# Patient Record
Sex: Female | Born: 1960 | Race: White | Hispanic: No | Smoking: Never smoker
Health system: Southern US, Community
[De-identification: ages and names within clinical notes are randomized; demographics above are authoritative.]

## PROBLEM LIST (undated history)

## (undated) DIAGNOSIS — M1990 Unspecified osteoarthritis, unspecified site: Secondary | ICD-10-CM

## (undated) DIAGNOSIS — I10 Essential (primary) hypertension: Secondary | ICD-10-CM

## (undated) DIAGNOSIS — G709 Myoneural disorder, unspecified: Secondary | ICD-10-CM

## (undated) DIAGNOSIS — J45909 Unspecified asthma, uncomplicated: Secondary | ICD-10-CM

## (undated) DIAGNOSIS — F329 Major depressive disorder, single episode, unspecified: Secondary | ICD-10-CM

## (undated) DIAGNOSIS — N189 Chronic kidney disease, unspecified: Secondary | ICD-10-CM

## (undated) DIAGNOSIS — T7840XA Allergy, unspecified, initial encounter: Secondary | ICD-10-CM

## (undated) DIAGNOSIS — F32A Depression, unspecified: Secondary | ICD-10-CM

## (undated) DIAGNOSIS — F419 Anxiety disorder, unspecified: Secondary | ICD-10-CM

## (undated) DIAGNOSIS — E785 Hyperlipidemia, unspecified: Secondary | ICD-10-CM

## (undated) HISTORY — DX: Unspecified asthma, uncomplicated: J45.909

## (undated) HISTORY — DX: Chronic kidney disease, unspecified: N18.9

## (undated) HISTORY — DX: Anxiety disorder, unspecified: F41.9

## (undated) HISTORY — DX: Essential (primary) hypertension: I10

## (undated) HISTORY — DX: Allergy, unspecified, initial encounter: T78.40XA

## (undated) HISTORY — PX: APPENDECTOMY: SHX54

## (undated) HISTORY — DX: Myoneural disorder, unspecified: G70.9

## (undated) HISTORY — DX: Major depressive disorder, single episode, unspecified: F32.9

## (undated) HISTORY — DX: Hyperlipidemia, unspecified: E78.5

## (undated) HISTORY — PX: ABDOMINAL HYSTERECTOMY: SHX81

## (undated) HISTORY — PX: CHOLECYSTECTOMY: SHX55

## (undated) HISTORY — DX: Unspecified osteoarthritis, unspecified site: M19.90

## (undated) HISTORY — PX: NASAL SINUS SURGERY: SHX719

## (undated) HISTORY — DX: Depression, unspecified: F32.A

## (undated) HISTORY — PX: BRAIN SURGERY: SHX531

## (undated) HISTORY — PX: OTHER SURGICAL HISTORY: SHX169

---

## 2008-07-19 ENCOUNTER — Ambulatory Visit: Payer: Self-pay | Admitting: Internal Medicine

## 2008-07-19 ENCOUNTER — Inpatient Hospital Stay (HOSPITAL_COMMUNITY): Admission: EM | Admit: 2008-07-19 | Discharge: 2008-07-25 | Payer: Self-pay | Admitting: Emergency Medicine

## 2008-07-20 ENCOUNTER — Encounter (INDEPENDENT_AMBULATORY_CARE_PROVIDER_SITE_OTHER): Payer: Self-pay | Admitting: Internal Medicine

## 2008-10-23 ENCOUNTER — Emergency Department (HOSPITAL_COMMUNITY): Admission: EM | Admit: 2008-10-23 | Discharge: 2008-10-23 | Payer: Self-pay | Admitting: Emergency Medicine

## 2010-10-05 IMAGING — CT CT HEAD W/O CM
3 of 5 series · 16 of 47 positions shown, 19 images · non-contrast
Comparison: None relevant.

CT HEAD

CLINICAL DATA: Neck and shoulder pain status post fall.  History
of surgery for Chiari malformation.

CT HEAD WITHOUT CONTRAST
CT CERVICAL SPINE WITHOUT CONTRAST
TECHNIQUE: Multidetector CT imaging of the head and cervical spine
was performed following the standard protocol without intravenous
contrast.  Multiplanar CT image reconstructions of the cervical
spine were also generated.

[Series 4: c_spine 2.0 b31s · axial · 0.23mm/px · z∈[+944,+1074]mm · 8 of 81 slices shown, 10 images]
[im 8/81  brain]
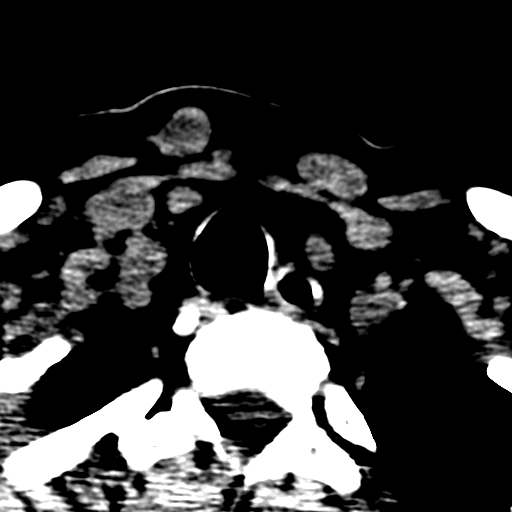
[im 8/81  bone]
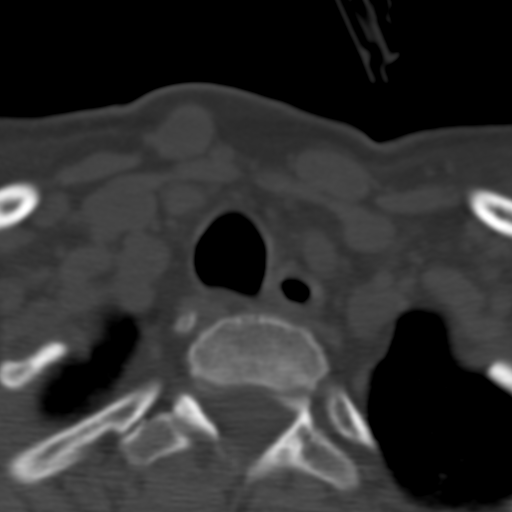
[im 15/81  bone]
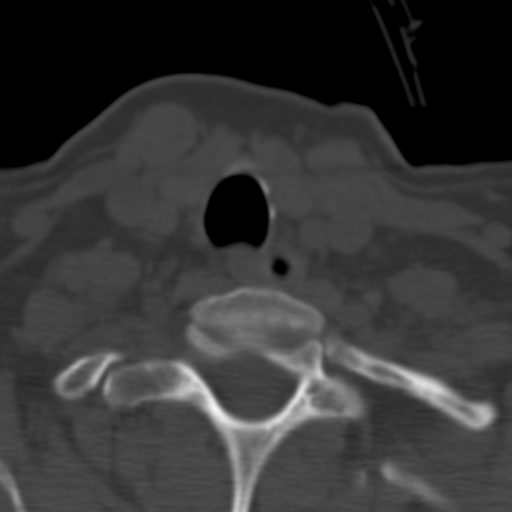
[im 30/81  bone]
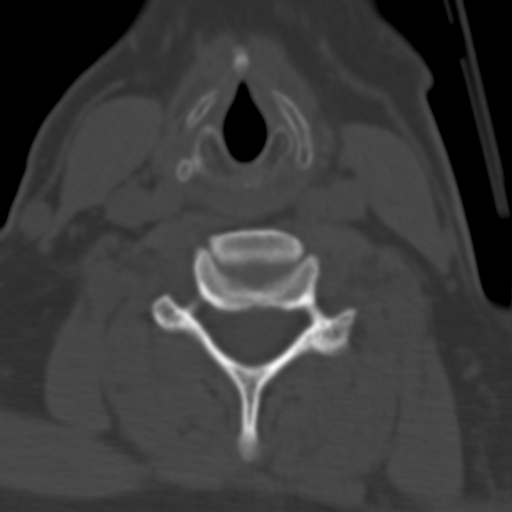
[im 37/81  bone]
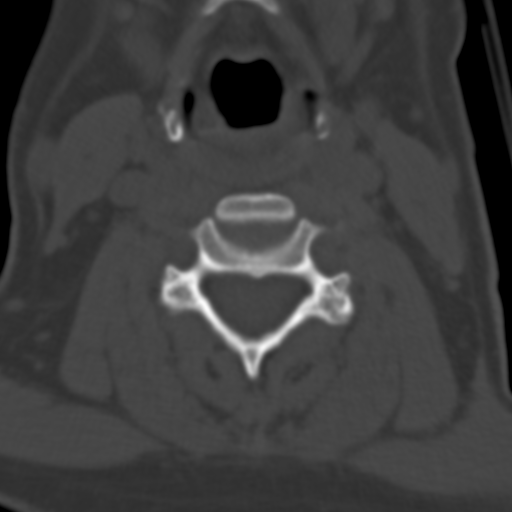
[im 44/81  brain]
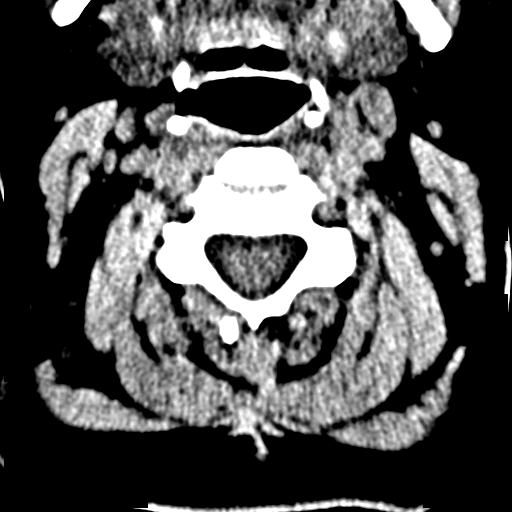
[im 44/81  bone]
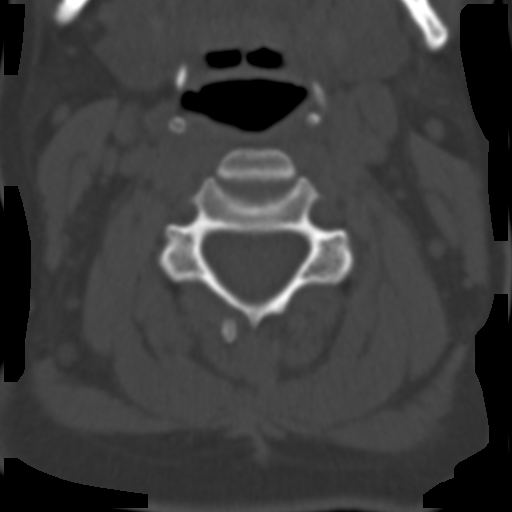
[im 51/81  bone]
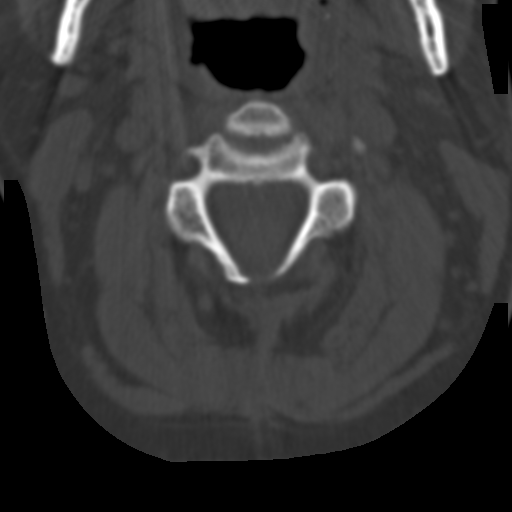
[im 66/81  bone]
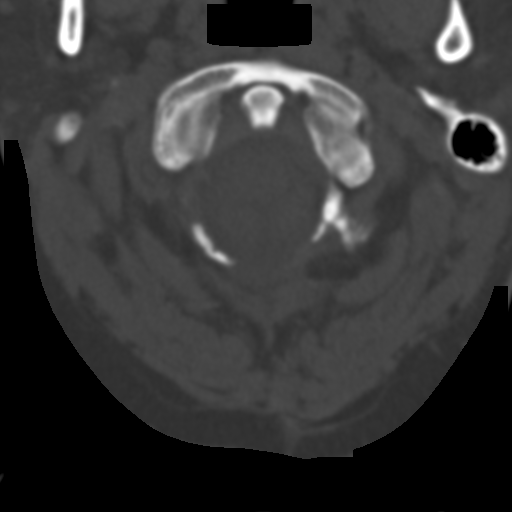
[im 73/81  bone]
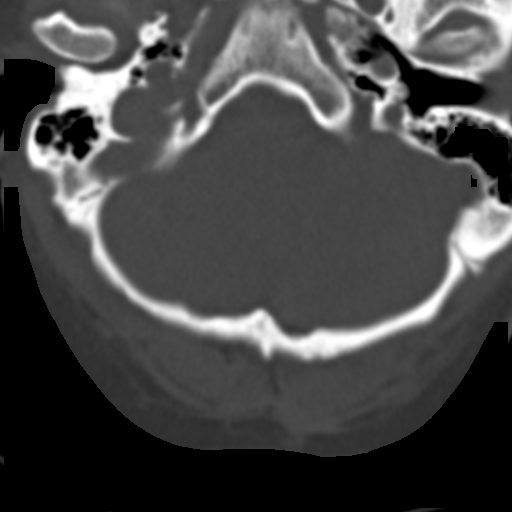

[Series 602: coronal · coronal · 0.31mm/px · 3 of 30 slices shown]
[im 10/30  bone]
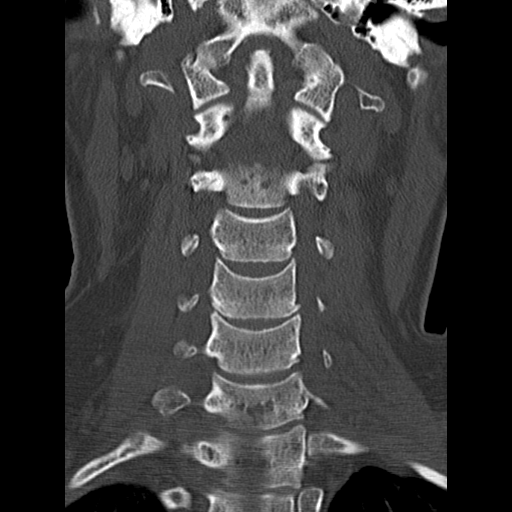
[im 13/30  bone]
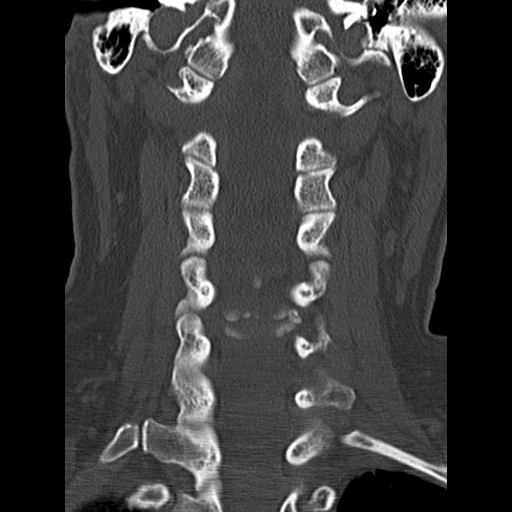
[im 17/30  bone]
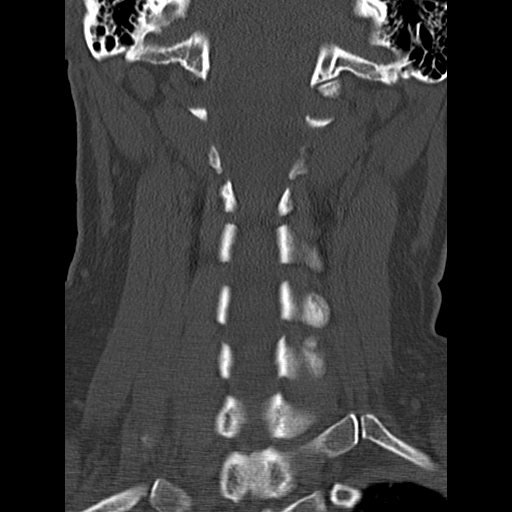

[Series 604: sagittal · sagittal · 0.31mm/px · 5 of 45 slices shown, 6 images]
[im 8/45  bone]
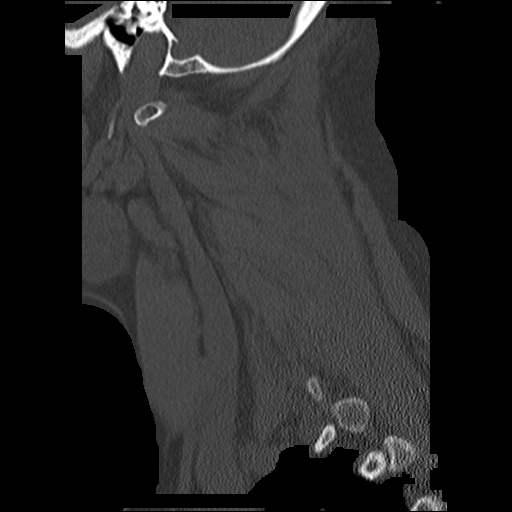
[im 15/45  bone]
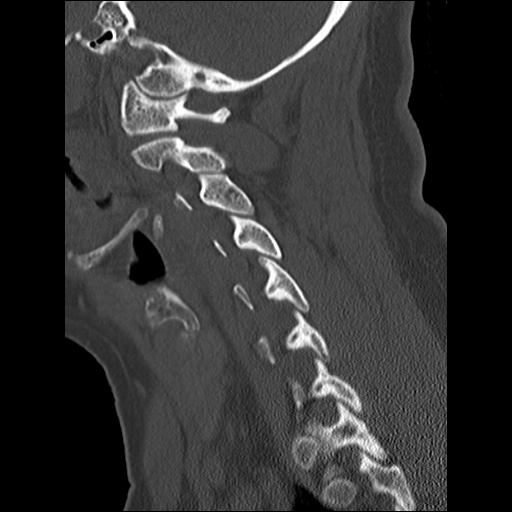
[im 23/45  brain]
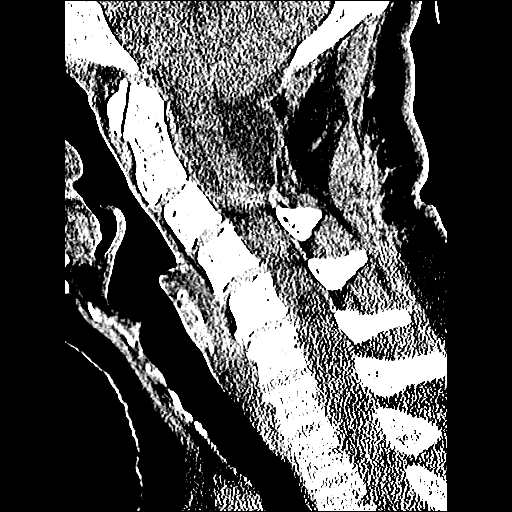
[im 23/45  bone]
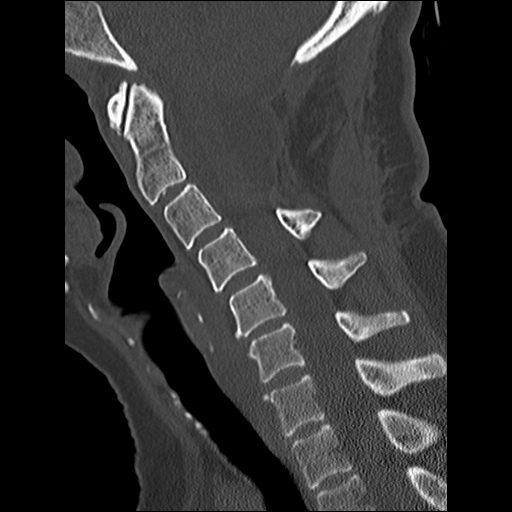
[im 30/45  bone]
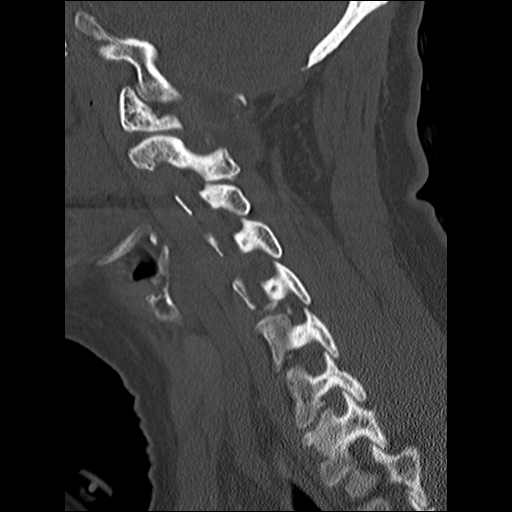
[im 37/45  bone]
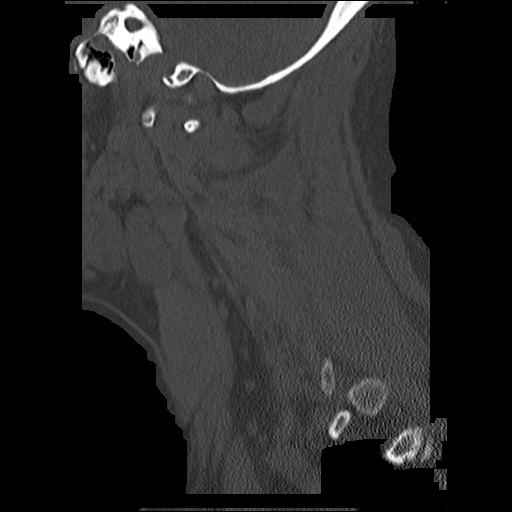

[16 of 47 positions shown; findings below may reference images not displayed]

FINDINGS: There are postsurgical changes status post occipital
craniectomy.  The cerebellar tonsils are low-lying.  There is no
evidence of acute intracranial hemorrhage, mass lesion, brain edema
or extra-axial fluid collection.  There is no hydrocephalus.

Mucosal thickening is noted throughout the maxillary and ethmoid
sinuses bilaterally.  There are possible air fluid levels in the
maxillary sinuses.  The mastoids are clear.  No acute calvarial
findings are seen.
IMPRESSION: 1.  No acute intracranial or calvarial findings.
2.  Ethmoid and maxillary sinus mucosal thickening with possible
air fluid levels suggesting sinusitis or possible occult facial
injury.

CT CERVICAL SPINE
FINDINGS: There is mild reversal of the usual cervical lordosis
without focal angulation or listhesis.  There has been previous
partial resection of the C1, C2 and C3 lamina bilaterally.  The
spinal canal is well decompressed with some dural ectasia.  There
is no evidence of acute fracture or traumatic subluxation.
IMPRESSION: 1.  Negative for acute cervical spine fracture, subluxation or
static signs of instability.
2.  Postsurgical changes in the upper cervical spine for
suboccipital decompression of a Chiari malformation.

## 2010-10-05 IMAGING — CR DG LUMBAR SPINE COMPLETE 4+V
5 series · 5 of 5 positions shown · non-contrast
Comparison: None

CLINICAL DATA: Low back pain radiating to both hips.  Status post
fall.

LUMBAR SPINE - COMPLETE 4+ VIEW

[t l-spine a.p.]
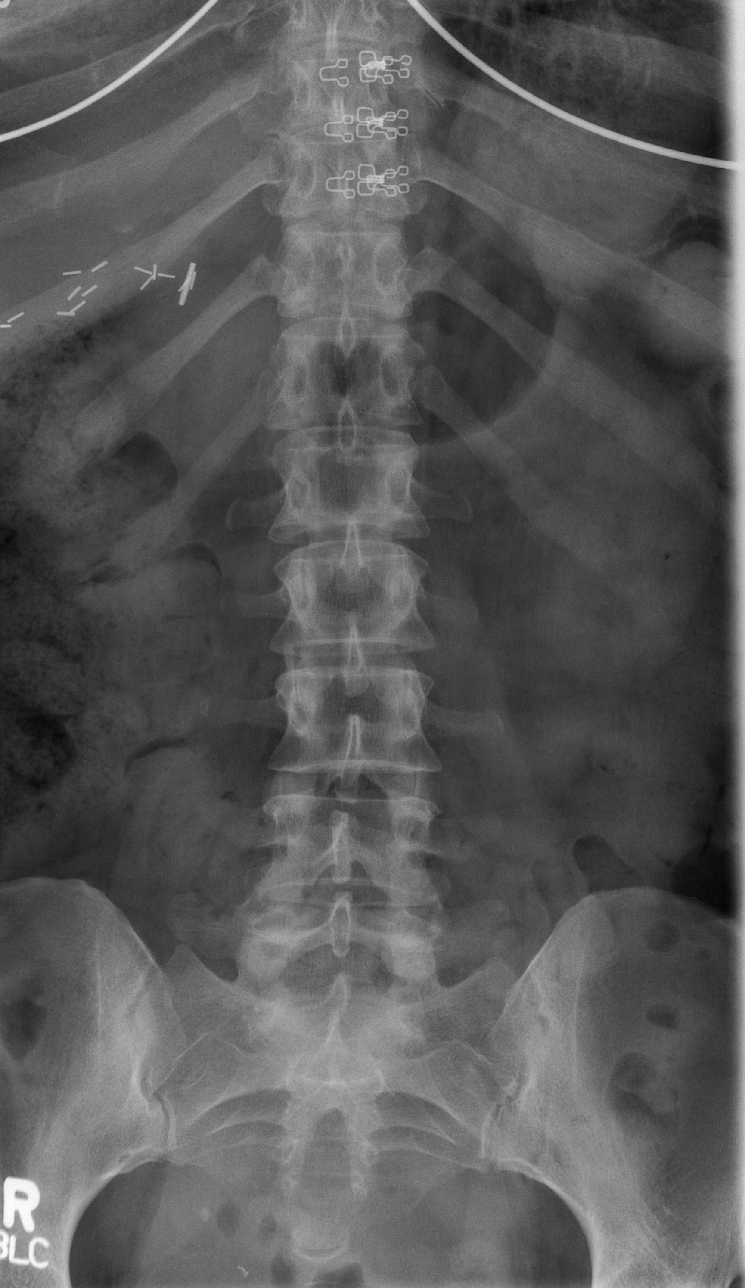

[t l-spine oblique exposure * (1 of 2)]
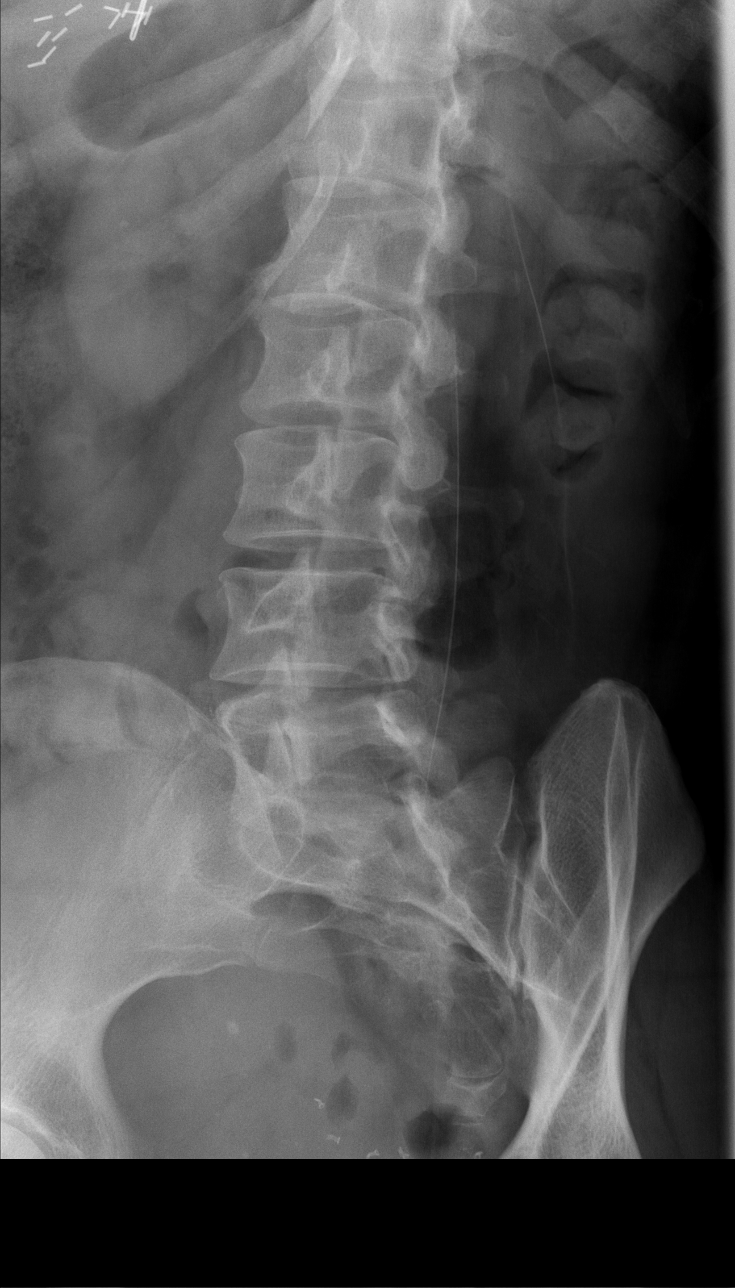

[t l-spine oblique exposure * (2 of 2)]
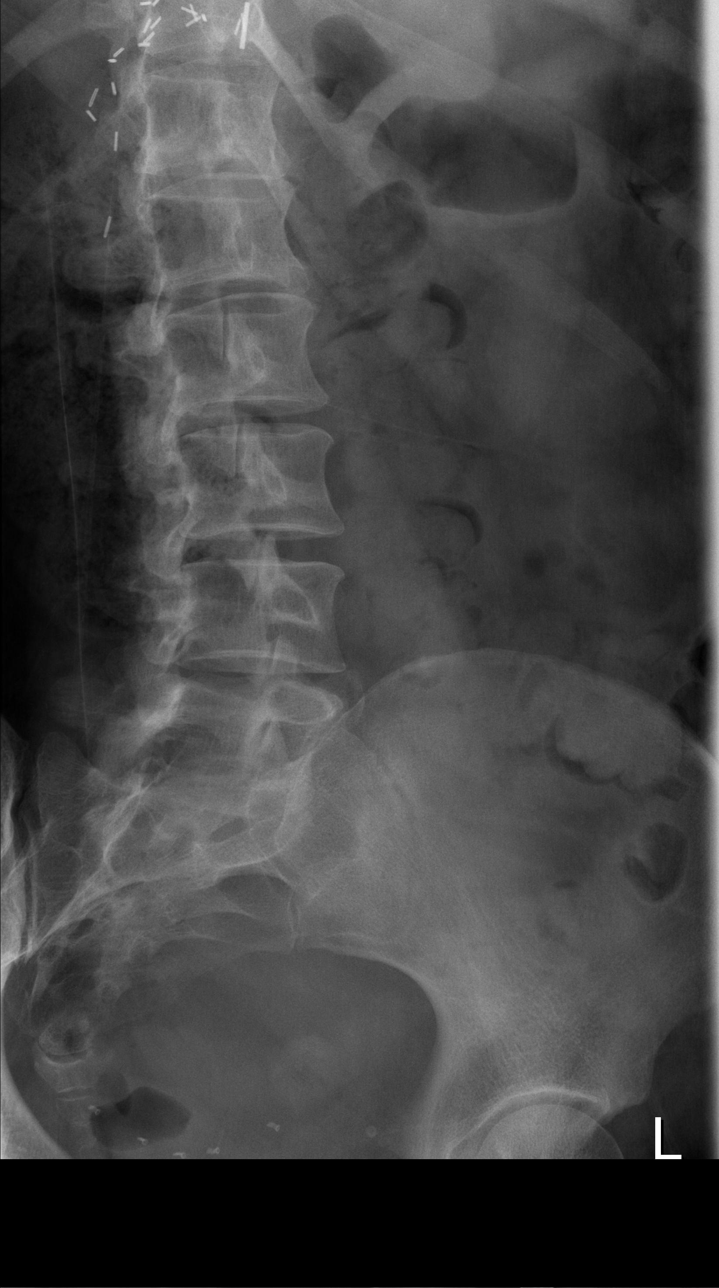

[t l-spine lat]
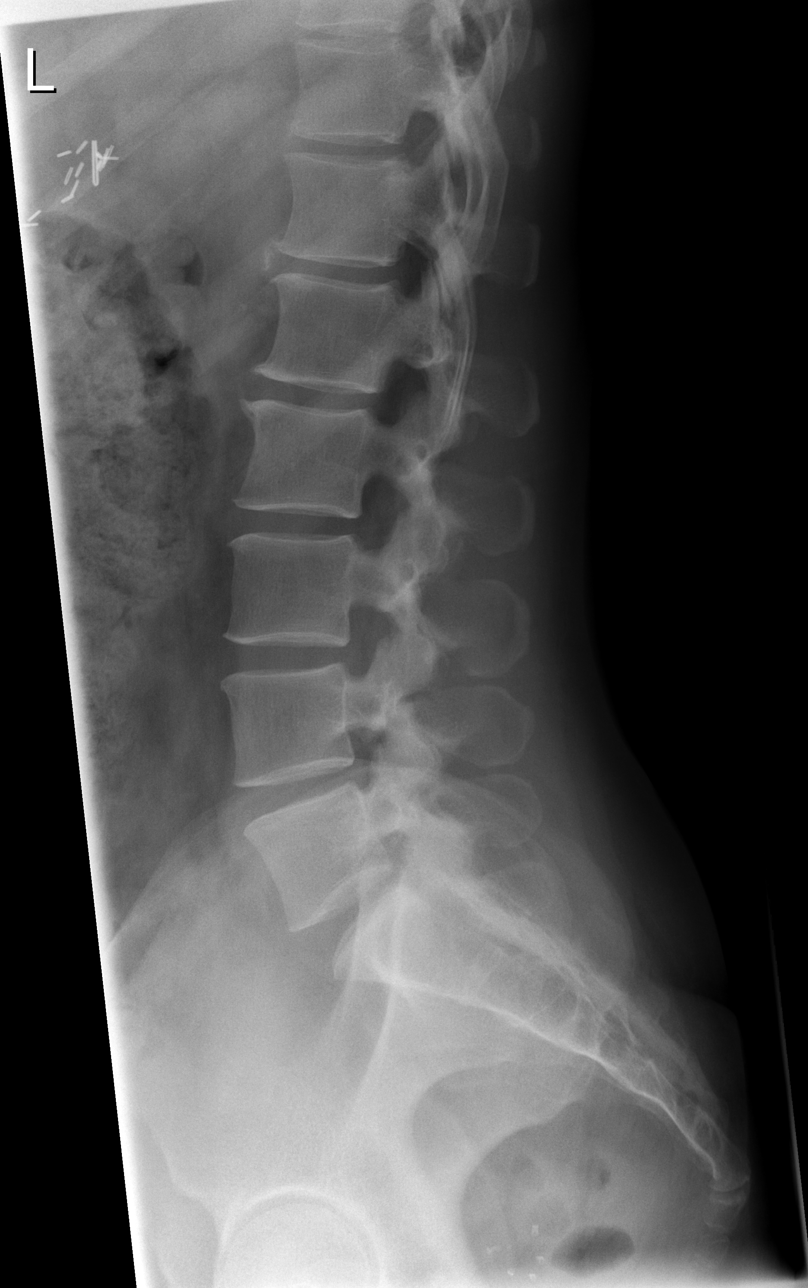

[t l-spine l5-s1 spot]
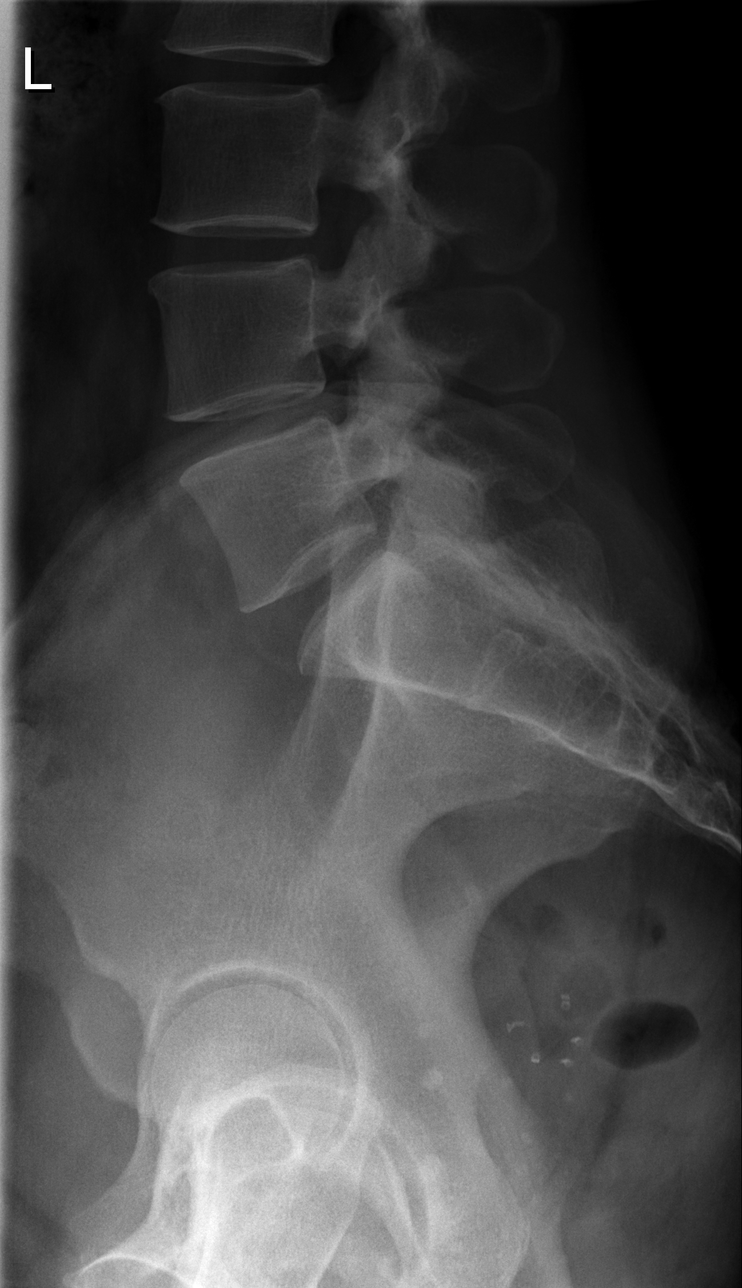

[5 of 5 positions shown; findings below may reference images not displayed]

FINDINGS: There are five lumbar type vertebral bodies in anatomic
alignment.  The lumbar disc spaces are preserved with mild
intervertebral spurring at T12-L1 and L1-L2.  There is no evidence
of acute fracture or subluxation.  Pelvic surgical clips and
phleboliths are noted.  There are additional surgical clips in the
right upper quadrant.
IMPRESSION: No evidence of acute lumbar spine injury.  Mild spondylosis.

## 2010-12-11 NOTE — Consult Note (Signed)
[name], [name] NO.:  [id number]   MEDICAL RECORD NO.:  [id number]          PATIENT TYPE:  INP   LOCATION:  4735                         FACILITY:  [facility]   PHYSICIAN:  Hillis Range, MD       DATE OF BIRTH:  12-27-1960   DATE OF CONSULTATION:  07/20/2008  DATE OF DISCHARGE:                                 CONSULTATION   REASON FOR CONSULTATION:  Chest pain.   HISTORY OF PRESENT ILLNESS:  Ms. Peggy Davis is a pleasant 50 year old female  with a history of hypertension, hyperlipidemia, and asthma, who presents  for further evaluation and management of chest discomfort.  She reports  being in her usual state of health until early Sunday morning, when she  developed symptoms of nausea and sharp epigastric discomfort, which was  worsened by drinking hot tea.  She subsequently shoveled snow on Sunday  morning and developed substernal chest pressure with associated  shortness of breath.  She denies palpitations, cough, fevers, chills, or  other symptoms at that time.  She notes that her symptoms subsequently  resolved.  She did well until Tuesday afternoon when she developed  recurrent symptoms of chest discomfort.  She presented to [facility] for further evaluation.  Since that time, she has  had several episodes of brief substernal chest discomfort with  associated shortness of breath, which has been unrelieved with  sublingual nitroglycerin.  She denies symptoms of heart failure and is  otherwise without complaint.   Transthoracic echocardiogram performed July 20, 2008, reveals a left  ventricular end-diastolic dimension of 42 with an IVS of 10, LAD 17.  The ejection fraction is 60% with no significant wall motion  abnormalities.  There is no significant valvular disease.   PAST MEDICAL HISTORY:  1. Hypertension.  2. Hyperlipidemia.  3. Anxiety.  4. Depression.  5. Asthma.  6. Chronic pain syndrome.  7. Allergic rhinitis.  8.  Migraines.  9. Status post cholecystectomy.  10.Status post hysterectomy with salpingo-oophorectomy.   ALLERGIES:  She has multiple allergies, are reviewed and listed in her  chart.   CURRENT MEDICATIONS:  1. Aspirin 325 mg daily.  2. Nortriptyline 100 mg nightly.  3. Claritin 10 mg daily.  4. Trazodone 300 mg daily.  5. Crestor 10 mg daily.  6. Toprol-XL 25 mg daily.  7. Protonix 40 mg daily.   SOCIAL HISTORY:  The patient denies tobacco, alcohol, or drug use.   FAMILY HISTORY:  Notable for the patient's maternal grandmother had  coronary artery disease.  The patient's mother has diabetes.   REVIEW OF SYSTEMS:  All systems are reviewed and negative except as  outlined in the HPI above.   PHYSICAL EXAMINATION:  VITAL SIGNS:  Blood pressure 94/72, heart rate  82, respirations 20, sats 97% on 2 liters, and temperature 97.8.  GENERAL:  The patient is a well-appearing female in no acute distress.  She is alert and oriented x3.  HEENT:  Normocephalic and atraumatic.  Sclerae clear.  Conjunctivae  pink.  Oropharynx clear.  NECK:  Supple.  No JVD, lymphadenopathy, or bruits.  LUNGS:  Clear to auscultation bilaterally.  HEART:  Regular rate and rhythm.  No murmurs, rubs, or gallops.  GI:  Soft, nontender, and nondistended.  Positive bowel sounds.  EXTREMITIES:  No clubbing, cyanosis, or edema.  NEUROLOGIC:  Strength and sensation are intact.  SKIN:  No ecchymosis or lacerations.  MUSCULOSKELETAL:  No deformity or atrophy.  PSYCHIATRIC:  Euthymic mood.  Full affect.   LABORATORY DATA:  Cardiac markers are negative x3.  Hemoglobin A1c 5.4.  Total cholesterol 211, triglycerides 110, HDL 46, and LDL 143.  Creatinine 0.94.  TSH 1.445.  Hematocrit 35.  D-dimer 0.25.   Chest x-ray reveals no airspace disease.   EKG reveals normal sinus rhythm and is otherwise unremarkable.   IMPRESSION:  Ms. Peggy Davis is a pleasant 50 year old female with a history  of hypertension, hyperlipidemia, and  asthma, who now presents with chest  discomfort.  Her risk factors for coronary artery disease include  hypertension, hyperlipidemia, and a family history of coronary artery  disease.  Her presenting chest discomfort has both typical and atypical  features.  Though her pain maybe secondary to coronary artery disease  and also suspicious for gastrointestinal source, given that her pain  previously worsened with drinking hot tea.  A chest CT has been ordered  by the primary team to evaluate for pulmonary embolus, though the  results are currently pending.   PLAN:  1. Continue aspirin 325 mg daily, metoprolol, and Crestor.  2. We will obtain an exercise Myoview to evaluate for ischemia.  If      this reveals moderate-to-high risk, then we would proceed to do      cardiac catheterization, however, if it is a low-risk study, then I      think that she could be treated conservatively.      Hillis Range, MD  Electronically Signed     JA/MEDQ  D:  07/20/2008  T:  07/21/2008  Job:  [id number]

## 2010-12-11 NOTE — Discharge Summary (Signed)
Peggy Davis, Peggy NO.:  [id number]   MEDICAL RECORD NO.:  [id number]          PATIENT TYPE:  INP   LOCATION:  4735                         FACILITY:  [facility]   PHYSICIAN:  Ileana Roup, M.D.  DATE OF BIRTH:  [birth year]   DATE OF ADMISSION:  07/19/2008  DATE OF DISCHARGE:  07/25/2008                               DISCHARGE SUMMARY   PRIMARY CARE PHYSICIAN:  Orie Rout. Duffy Rhody, MD, [facility].   ATTENDING PHYSICIAN:  Ileana Roup, MD   CONSULTANTS:  1. Hillis Range, MD, of [facility].  2. Bernette Redbird, MD, of [facility].   DISCHARGE DIAGNOSES:  1. Chest pain - most likely gastrointestinal in nature, ruled out for      coronary artery disease with negative Myoview, the patient also had      an unremarkable upper GI series and a CT angio of the chest that      was negative for acute pulmonary embolism or pulmonary pathology.  2. Hypertension.  3. Hyperlipidemia.  4. Anxiety.  5. Depression.  6. Asthma.  7. Chronic pain syndrome.  8. Allergic rhinitis.  9. Migraines.  10.Status post cholecystectomy.  11.Status post hysterectomy with salpingo-oophorectomy.   MEDICATIONS UPON DISCHARGE:  1. Toprol-XL 25 mg p.o. daily.  2. Pamelor 100 mg p.o. nightly.  3. Protonix 40 mg p.o. b.i.d., prescription given.  4. Crestor 10 mg p.o. daily, prescription given.  5. Trazodone 300 mg p.o. nightly.  6. Vicodin 5 mg/325 mg, 1 tablet q.6-8 h. p.r.n. pain, prescription      given for 30 pills.  7. Sublingual nitroglycerin 0.4 mg tabs, prescription given for 30      pills.  8. Claritin 10 mg p.o. daily.   CONDITION AT DISCHARGE:  The patient was chest pain free and stable upon  discharge.  She is to follow up with her primary care Andriana Casa Dr. Duffy Rhody  and an appointment was scheduled for July 02, 2009, at 1:30 p.m.  The  patient also has an appointment scheduled with Dr. Reita Cliche of [facility] on  September 26, 2008, at 1:45 p.m. for further workup  including possible manometry.   PROCEDURES:  1. Nuclear medicine Myoview with the following impression, no clear      evidence of reversible ischemia infarction.  A small defect within      the mid segment of the anterior wall likely relates to variable      breast attenuation.  Of note, the patient did not achieve target      heart rate and therefore study could underestimate ischemia.  No      focal wall motion abnormality.  Left ventricular ejection fraction      is equal to 73%.  2. Upper gastrointestinal with the following impression:  Some      residual food noted within stomach.  Otherwise, normal upper      gastrointestinal series.  3. CT angio of the chest with the following impression:  No evidence      of acute pulmonary embolism.  Mild dependent atelectasis.  No other  acute findings.  4. A 2-D echo with the following summary:  Overall left ventricular      systolic function was normal.  Left LVEF was estimated to be 60%.      There was no diagnostic evidence of LV regional wall motion      abnormalities.  Normal aortic and mitral valves.   CONSULTATIONS:  1. Hillis Range, MD, of [facility], consulted on July 20, 2008.  2. Bernette Redbird, MD, consulted on July 23, 2008.   ADMISSION HISTORY AND PHYSICAL:  The patient is a 50 year old female  with past medical history as described above who presented to the ED  complaining of chest pain that began on the Sunday prior to admission, 2  days prior to admission.  The patient reports that she awoke with mild  chest pain, localized on the mid sternum, pressure type, aggravated by  drinking with no radiation.  The patient went out after shoveling snow  and the pain was more severe.  She checked her blood pressure and it was  elevated in the 180s.  The patient endorses weakness as associated  symptoms and also has nausea and vomiting sensation, but she is  yet to  vomit.  The patient's pain resolved after rest and Valium.  No  complaints on the day prior to admission and on the day of admission,  the pain was severe again.  Following the advice of her primary care  Babbie Dondlinger, she came to the ED for evaluation.  Pain subsided with GI  cocktail and nitroglycerin lasting 4-5 hours.   PHYSICAL EXAMINATION:  INITIAL VITAL SIGNS:  Temperature of 98.3, blood  pressure of 145/95, pulse of 85, respiratory rate of 16, and O2  saturation is 100% on room air.  GENERAL:  Calm and in no apparent distress.  EYES:  PERRL, EOMI, no nystagmus.  ENT:  Mouth and pharynx are without abnormality.  NECK:  No bruits.  No thyromegaly.  RESPIRATORY:  Clear to auscultation bilaterally.  No wheezing.  CARDIOVASCULAR:  Regular rate and rhythm.  No murmur auscultated.  GI:  Soft, nontender, and nondistended, plus normoactive bowel sounds.  No guarding.  EXTREMITIES:  No cyanosis, clubbing, or edema.  NEUROLOGIC:  Alert and oriented x3.  Cranial nerves intact.  No focal  deficits.  PSYCHIATRIC:  Mildly anxious.   INITIAL LABORATORIES:  Sodium 140, potassium of 4, chloride of 104,  bicarbonate 28, BUN of 8, creatinine of 0.75, and glucose of 88.  Hemoglobin of 14, white blood cell count of 4.4, platelets of 340, and  MCV of 93.   HOSPITAL COURSE:  1. Chest pain:  Given the patient has a history of hypertension,      hyperlipidemia, and a positive family history with a grandmother      who had multiple MIs, the patient has a moderate risk for coronary      artery disease.  Given this, her cardiac enzymes were cycled x3 and      were not elevated.  The patient also received a Nuclear Medicine      Myoview, which did not reveal any ischemia.  Given the possibility      of a pulmonary embolism, a CT angiogram of the chest was performed,      which was negative for an acute PE.  The CT also did not reveal any      other acute pulmonary pathology that could explain her  chest pain.  Given that a cardiogenic and pulmonary etiology had been ruled out      through diagnostic imaging, it is believed that a GI etiology is      most likely responsible for her pain.  An upper GI was performed,      which did not reveal any pathology.  Dr. Matthias Hughs was consulted and      was unable to find a definitive cause of her chest pain, but      thought that she may be a candidate for outpatient followup in the      [facility].  The patient has benefited from      sublingual nitroglycerin during the hospitalization, and she was      provided with a prescription for this to continue as an outpatient.      She was scheduled to see her PCP within the next week, and then to      see [facility] where they are able to      do outpatient manometry studies and has an appointment with them      for September 27, 2007.  The patient was also continued on Protonix for      her pain.  2. Hypertension.  The patient's blood pressure was well controlled      with Toprol-XL 25 mg daily and she should continue with this as an      outpatient.  3. Hyperlipidemia:  The patient was continued on Crestor during this      hospitalization and had a fasting lipid panel, which showed a LDL      of 143 and an HDL of 46.  She may benefit from tighter cholesterol      control, which should be done as an outpatient.  4. Depression/anxiety.  The patient was continued on her Pamelor and      trazodone.  It is possible that there is a psychogenic component to      her chest pain and this should be followed up as an outpatient.  5. Chronic pain:  The patient was continued on her p.r.n. Vicodin and      was given a short prescription for this and should be reevaluated      as an outpatient.   DISCHARGE LABS AND VITALS:  Vital signs: Temperature of 97.4, pulse of 74, blood pressure 135/91,  respiratory rate of 18, and O2 sat of 95% on room air.   Last set of  labs:  Sodium of 141, potassium 3.9, chloride 105,  bicarbonate 29, glucose 89, BUN of 8, creatinine of 0.8, and calcium of  9.7.  White blood cell count 5.2, hemoglobin 12.8, hematocrit of 37.7,  and platelet count of 308.   Pending labs:  There were no pending labs at this time.      Linward Foster, MD  Electronically Signed      Ileana Roup, M.D.  Electronically Signed    LW/MEDQ  D:  07/25/2008  T:  07/26/2008  Job:  [id number]   cc:   Yolanda Bonine, MD  Reita Cliche, MD

## 2010-12-11 NOTE — Consult Note (Signed)
Peggy Davis, Davis NO.:  [id number]   MEDICAL RECORD NO.:  [id number]          PATIENT TYPE:  INP   LOCATION:  4735                         FACILITY:  [facility]   PHYSICIAN:  Bernette Redbird, M.D.   DATE OF BIRTH:  1960-08-04   DATE OF CONSULTATION:  07/23/2008  DATE OF DISCHARGE:                                 CONSULTATION   The Internal Medicine Teaching Service, Dr. Margarito Liner attending,  asked [place] to see this 50 year old female because of recurring chest pain  of noncardiac origin.   Peggy suffers from severe anxiety and depression.  She was admitted  to the hospital 3 days ago with chest pain.  She had had some mild chest  discomfort, which awakened her in the morning that became more  pronounced as she went outside and shoveled snow.  The chest pain was  made markedly worse by swallowing liquids, but not by eating food.  She  presented to the emergency room where she ruled out for an MI and a  subsequent stress test was negative.  Since then, she is continued to  have intermittent chest pain primarily upon swallowing.   PAST MEDICAL HISTORY:   ALLERGIES:  PERCOCET, CODEINE, PENICILLIN, MORPHINE, LEVAQUIN,  TERBUTALINE, and REMERON.   CURRENT MEDICATIONS:  Aspirin, Claritin, Toprol, Pamelor, Protonix,  Crestor, Desyrel, and p.r.n. medications for anxiety and chest pain and  nausea.   OPERATIONS:  Cholecystectomy, hysterectomy, salpingo-oophorectomy,  chronic medical illnesses, chronic pain, depression, anxiety, asthma,  hyperlipidemia, hypertension, and migraines.   HABITS:  Nonsmoker, nondrinker.   FAMILY HISTORY:  Diabetes in her mother.   SOCIAL HISTORY:  I believe she lives with her parents.   REVIEW OF SYSTEMS:  See HPI.   PHYSICAL EXAMINATION:  GENERAL:  A pleasant, very articulate Caucasian  female, who does not appear overtly depressed, but perhaps slightly  anxious during my interview.  She has some degree of pressure of speech.  She is anicteric without pallor.  CHEST:  Clear to auscultation, no wheezes heard at this time.  HEART:  Normal, no gallops, rubs, murmurs, clicks, or arrhythmias  appreciated.  ABDOMEN:  Benign.  No organomegaly, guarding, mass or tenderness.   LABORATORY DATA:  Unrevealing.  Admission hemogram within normal limits.  Chemistry panel shows minimally low albumin of 3.4 following hydration.  Liver chemistries are normal.  Lipase normal on admission.  LDL  cholesterol is somewhat elevated at 143 and total cholesterol is  elevated at 211.  TSH normal.   X-RAY STUDIES:  A CT angiogram of the chest shows no evidence of  pulmonary embolism, adenopathy, mediastinal mass, pleural or pericardial  effusions or lung masses.   IMPRESSION:  Nonspecific chest pain, which sounds somewhat  nonphysiologic in character and that it is provoked by swallowing  liquids, but not solids.  The differential diagnosis could conceivably  include some variant of reflux disease, mucosal ulceration of the  esophagus from pill-induced stasis esophagitis, esophageal spasm, or  perhaps most likely, functional esophageal pain or so-called irritable  esophagus.   RECOMMENDATIONS:  I concur with the plan for radiographic evaluation of  the  upper GI tract.  I will add a barium tablet to the study to see if  there is evidence of anatomic or functional obstruction of the  esophagus, although the patient does not really report dysphagia  symptoms.  The patient is already on high-dose PPI therapy to help take  acid out of the equation.  If the above studies are unrevealing, the  additive yield of an upper endoscopy would be quite low, but it could  still be considered.   If her symptoms persist, she might be a candidate for evaluation at a  [facility].  In the end, I think this will probably  turn out to be a variant of her psychiatric condition, perhaps a form of  somatization, although of course, it is  important to try to exclude  other causes first.  However, her history is not characteristic of any  typical organic pathologic process that I can bring to mind.           ______________________________  Bernette Redbird, M.D.     RB/MEDQ  D:  07/23/2008  T:  07/23/2008  Job:  [id number]

## 2011-05-03 LAB — BASIC METABOLIC PANEL
BUN: 8 mg/dL (ref 6–23)
BUN: 8 mg/dL (ref 6–23)
CO2: 27 mEq/L (ref 19–32)
CO2: 28 mEq/L (ref 19–32)
Calcium: 8.7 mg/dL (ref 8.4–10.5)
Calcium: 9.7 mg/dL (ref 8.4–10.5)
Chloride: 104 mEq/L (ref 96–112)
Chloride: 110 mEq/L (ref 96–112)
Creatinine, Ser: 0.75 mg/dL (ref 0.4–1.2)
GFR calc Af Amer: >60 mL/min (ref >60)
GFR calc Af Amer: >60 mL/min (ref >60)
GFR calc non Af Amer: >60 mL/min (ref >60)
GFR calc non Af Amer: >60 mL/min (ref >60)
Glucose, Bld: 85 mg/dL (ref 70–99)
Glucose, Bld: 89 mg/dL (ref 70–99)
Potassium: 3.8 mEq/L (ref 3.5–5.1)
Potassium: 3.9 mEq/L (ref 3.5–5.1)
Potassium: 4 mEq/L (ref 3.5–5.1)
Sodium: 137 mEq/L (ref 135–145)
Sodium: 140 mEq/L (ref 135–145)

## 2011-05-03 LAB — CK TOTAL AND CKMB (NOT AT ARMC)
CK, MB: 0.8 ng/mL (ref 0.3–4.0)
Relative Index: INVALID (ref 0.0–2.5)

## 2011-05-03 LAB — POCT CARDIAC MARKERS: Troponin i, poc: <0.05 ng/mL (ref 0.00–0.09)

## 2011-05-03 LAB — LIPID PANEL
Cholesterol: 211 mg/dL — ABNORMAL HIGH (ref 0–200)
HDL: 46 mg/dL (ref >39)
LDL Cholesterol: 143 mg/dL — ABNORMAL HIGH (ref 0–99)

## 2011-05-03 LAB — PROTIME-INR
INR: 1.1 (ref 0.00–1.49)
INR: 9.3 (ref 0.00–1.49)
Prothrombin Time: 14.2 seconds (ref 11.6–15.2)

## 2011-05-03 LAB — APTT: aPTT: 30 seconds (ref 24–37)

## 2011-05-03 LAB — CARDIAC PANEL(CRET KIN+CKTOT+MB+TROPI)
Relative Index: INVALID (ref 0.0–2.5)
Relative Index: INVALID (ref 0.0–2.5)
Total CK: 56 U/L (ref 7–177)
Troponin I: 0.02 ng/mL (ref 0.00–0.06)

## 2011-05-03 LAB — CBC
HCT: 30.8 % — ABNORMAL LOW (ref 36.0–46.0)
HCT: 35.3 % — ABNORMAL LOW (ref 36.0–46.0)
HCT: 37.7 % (ref 36.0–46.0)
HCT: 41.1 % (ref 36.0–46.0)
Hemoglobin: 10.5 g/dL — ABNORMAL LOW (ref 12.0–15.0)
Hemoglobin: 11.7 g/dL — ABNORMAL LOW (ref 12.0–15.0)
MCHC: 33.9 g/dL (ref 30.0–36.0)
MCHC: 34 g/dL (ref 30.0–36.0)
MCHC: 34 g/dL (ref 30.0–36.0)
MCV: 91.1 fL (ref 78.0–100.0)
MCV: 92.7 fL (ref 78.0–100.0)
Platelets: 287 10*3/uL (ref 150–400)
Platelets: 308 10*3/uL (ref 150–400)
RBC: 3.82 MIL/uL — ABNORMAL LOW (ref 3.87–5.11)
RBC: 4.44 MIL/uL (ref 3.87–5.11)
RDW: 12.1 % (ref 11.5–15.5)
RDW: 12.4 % (ref 11.5–15.5)
RDW: 12.5 % (ref 11.5–15.5)
WBC: 4.4 10*3/uL (ref 4.0–10.5)
WBC: 5.2 10*3/uL (ref 4.0–10.5)

## 2011-05-03 LAB — HEPATIC FUNCTION PANEL
AST: 24 U/L (ref 0–37)
Albumin: 4.1 g/dL (ref 3.5–5.2)
Total Protein: 6.6 g/dL (ref 6.0–8.3)

## 2011-05-03 LAB — COMPREHENSIVE METABOLIC PANEL
Albumin: 3.4 g/dL — ABNORMAL LOW (ref 3.5–5.2)
BUN: 10 mg/dL (ref 6–23)
Calcium: 9.5 mg/dL (ref 8.4–10.5)
Chloride: 106 mEq/L (ref 96–112)
Creatinine, Ser: 0.94 mg/dL (ref 0.4–1.2)
GFR calc non Af Amer: >60 mL/min (ref >60)
Total Bilirubin: 0.6 mg/dL (ref 0.3–1.2)

## 2011-05-03 LAB — D-DIMER, QUANTITATIVE
D-Dimer, Quant: 0.25 ug/mL-FEU (ref 0.00–0.48)
D-Dimer, Quant: <0.22 ug/mL-FEU (ref 0.00–0.48)

## 2011-05-03 LAB — LIPASE, BLOOD: Lipase: 22 U/L (ref 11–59)

## 2011-05-03 LAB — HEMOGLOBIN A1C: Mean Plasma Glucose: 108 mg/dL

## 2011-05-03 LAB — TSH: TSH: 1.445 u[IU]/mL (ref 0.350–4.500)

## 2011-10-22 ENCOUNTER — Ambulatory Visit: Payer: Medicaid Other | Attending: Family Medicine | Admitting: Physical Therapy

## 2011-11-06 ENCOUNTER — Ambulatory Visit: Payer: Medicaid Other | Attending: Family Medicine

## 2011-11-06 DIAGNOSIS — M2550 Pain in unspecified joint: Secondary | ICD-10-CM | POA: Insufficient documentation

## 2011-11-06 DIAGNOSIS — IMO0001 Reserved for inherently not codable concepts without codable children: Secondary | ICD-10-CM | POA: Insufficient documentation

## 2011-11-11 ENCOUNTER — Ambulatory Visit: Payer: Medicaid Other | Admitting: Physical Therapy

## 2011-11-18 ENCOUNTER — Ambulatory Visit: Payer: Medicaid Other | Admitting: Physical Therapy

## 2011-11-25 ENCOUNTER — Ambulatory Visit: Payer: Medicaid Other | Admitting: Physical Therapy

## 2011-12-02 ENCOUNTER — Ambulatory Visit: Payer: Medicaid Other | Attending: Psychiatry | Admitting: Physical Therapy

## 2011-12-02 DIAGNOSIS — IMO0001 Reserved for inherently not codable concepts without codable children: Secondary | ICD-10-CM | POA: Insufficient documentation

## 2011-12-02 DIAGNOSIS — M2550 Pain in unspecified joint: Secondary | ICD-10-CM | POA: Insufficient documentation

## 2012-01-17 ENCOUNTER — Encounter: Payer: Self-pay | Admitting: Physical Medicine & Rehabilitation

## 2012-01-21 ENCOUNTER — Encounter: Payer: Medicaid Other | Attending: Physical Medicine & Rehabilitation

## 2012-01-21 ENCOUNTER — Ambulatory Visit (HOSPITAL_BASED_OUTPATIENT_CLINIC_OR_DEPARTMENT_OTHER): Payer: Medicaid Other | Admitting: Physical Medicine & Rehabilitation

## 2012-01-21 ENCOUNTER — Encounter: Payer: Self-pay | Admitting: Physical Medicine & Rehabilitation

## 2012-01-21 VITALS — BP 135/72 | HR 73 | Resp 16 | Ht 65.5 in | Wt 130.0 lb

## 2012-01-21 DIAGNOSIS — M25569 Pain in unspecified knee: Secondary | ICD-10-CM | POA: Insufficient documentation

## 2012-01-21 DIAGNOSIS — B0229 Other postherpetic nervous system involvement: Secondary | ICD-10-CM | POA: Insufficient documentation

## 2012-01-21 DIAGNOSIS — M797 Fibromyalgia: Secondary | ICD-10-CM

## 2012-01-21 DIAGNOSIS — IMO0001 Reserved for inherently not codable concepts without codable children: Secondary | ICD-10-CM | POA: Insufficient documentation

## 2012-01-21 DIAGNOSIS — M5481 Occipital neuralgia: Secondary | ICD-10-CM

## 2012-01-21 DIAGNOSIS — M7918 Myalgia, other site: Secondary | ICD-10-CM | POA: Insufficient documentation

## 2012-01-21 DIAGNOSIS — M25559 Pain in unspecified hip: Secondary | ICD-10-CM | POA: Insufficient documentation

## 2012-01-21 DIAGNOSIS — G894 Chronic pain syndrome: Secondary | ICD-10-CM | POA: Insufficient documentation

## 2012-01-21 DIAGNOSIS — M531 Cervicobrachial syndrome: Secondary | ICD-10-CM

## 2012-01-21 DIAGNOSIS — G935 Compression of brain: Secondary | ICD-10-CM | POA: Insufficient documentation

## 2012-01-21 NOTE — Progress Notes (Signed)
Subjective:    Patient ID: Peggy Davis, female    DOB: [birth year], 51 y.o.   MRN: [id number]  HPI Dr. Roetta Sessions was PM&R MD trigger point injections last about 1 year ago.Not prescribing meds  Has had 4 visits with PT relaxation focus, showed some exercises Mid thoracic paraspinal muscle weakness, has been continuing exercises R side lumbar shingles with hyper sensitivity since that time. 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 General activity 7 Relation with others 7 Enjoyment of life 7 What TIME of day is your pain at its worst? constant Sleep (in general) Poor  Pain is worse with: unsure and it depends on situation Pain improves with: therapy/exercise and injections Relief from Meds: 7  Mobility walk without assistance how many minutes can you walk? 60 ability to climb steps?  yes do you drive?  yes  Function disabled: date disabled 01/10/1992  Neuro/Psych numbness tingling spasms dizziness depression anxiety loss of taste or smell  Prior Studies Any changes since last visit?  no  Physicians involved in your care Any changes since last visit?  no   Family History  Problem Relation Age of Onset  . Diabetes Mother   . Cancer Father    History   Social History  . Marital Status: Legally Separated    Spouse Name: N/A    Number of Children: N/A  . Years of Education: N/A   Social History Main Topics  . Smoking status: Never Smoker   . Smokeless tobacco: Never Used  . Alcohol Use: None  . Drug Use: None  . Sexually Active: None   Other Topics Concern  . None   Social History Narrative  . None   Past Surgical History  Procedure Date  . Abdominal hysterectomy   . Brain surgery   . Nasal sinus surgery   . Cholecystectomy   .  Appendectomy   . Exploratory on hand    Past Medical History  Diagnosis Date  . Allergy   . Anxiety   . Asthma   . Depression   . Hypertension   . Hyperlipidemia   . Neuromuscular disorder   . Chronic kidney disease   . Arthritis    BP 135/72  Pulse 73  Resp 16  Ht 5' 5.5" (1.664 m)  Wt 130 lb (58.968 kg)  BMI 21.30 kg/m2  SpO2 99%    Review of Systems  Respiratory: Positive for apnea, shortness of breath and wheezing.   Musculoskeletal:       Spasm  Neurological: Positive for dizziness and numbness.       Tingling  Psychiatric/Behavioral: Positive for dysphoric mood. The patient is nervous/anxious.   All other systems reviewed and are negative.       Objective:   Physical Exam  Constitutional: She appears well-developed and well-nourished.  Musculoskeletal:       Cervical back: She exhibits decreased range of motion and tenderness.       Back:       Tenderness over right hip left knee Tenderness suboccipital and upper trapezius bilateral Hypersensitivity to touch right lower lumbar No evidence of scarring  Neurological: She displays no tremor. A sensory deficit is present. She exhibits normal muscle tone. Coordination and gait normal.  Reflex Scores:      Tricep reflexes are  1+ on the right side and 1+ on the left side.      Bicep reflexes are 1+ on the right side and 1+ on the left side.      Brachioradialis reflexes are 1+ on the right side and 1+ on the left side.      Patellar reflexes are 1+ on the right side and 1+ on the left side.      Achilles reflexes are 1+ on the left side.      R side pp deficit throughout excluding facial  Psychiatric: She has a normal mood and affect.          Assessment & Plan:  1. Chronic pain syndrome multifactorial complicated by multiple drug allergies. 2. fibromyalgia syndrome at this point I think this is causing her left knee pain and probably her right hip pain. 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 Postherpetic neuralgia right lumbar area will trial Lidoderm patch   Point injections Indications myofascial pain syndrome only partially response to medication management and conservative care including physical therapy. Informed consent was obtained after starting risks and benefits of the patient with patient include bleeding bruising and infection she elects to proceed and has given written consent Patient placed in a seated position 3 areas on the right and 3 areas on the left were marked. 2 were on the trapezius and 1 was on the the greater scapula at the upper medial scapular border on each side. 25-gauge 1.5 inch needle used. 1 cc of lidocaine injected into each spot area patient tolerated procedure well. Post procedure instructions given.  Neck stretching exercises pamphlet given

## 2012-01-21 NOTE — Patient Instructions (Signed)
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

## 2012-04-22 ENCOUNTER — Encounter
Payer: Medicaid Other | Attending: Physical Medicine and Rehabilitation | Admitting: Physical Medicine and Rehabilitation

## 2012-04-22 ENCOUNTER — Encounter: Payer: Self-pay | Admitting: Physical Medicine and Rehabilitation

## 2012-04-22 VITALS — BP 119/84 | HR 70 | Resp 14 | Ht 65.0 in | Wt 130.0 lb

## 2012-04-22 DIAGNOSIS — M25559 Pain in unspecified hip: Secondary | ICD-10-CM | POA: Insufficient documentation

## 2012-04-22 DIAGNOSIS — G894 Chronic pain syndrome: Secondary | ICD-10-CM | POA: Insufficient documentation

## 2012-04-22 DIAGNOSIS — N189 Chronic kidney disease, unspecified: Secondary | ICD-10-CM | POA: Insufficient documentation

## 2012-04-22 DIAGNOSIS — M797 Fibromyalgia: Secondary | ICD-10-CM

## 2012-04-22 DIAGNOSIS — E785 Hyperlipidemia, unspecified: Secondary | ICD-10-CM | POA: Insufficient documentation

## 2012-04-22 DIAGNOSIS — I129 Hypertensive chronic kidney disease with stage 1 through stage 4 chronic kidney disease, or unspecified chronic kidney disease: Secondary | ICD-10-CM | POA: Insufficient documentation

## 2012-04-22 DIAGNOSIS — IMO0001 Reserved for inherently not codable concepts without codable children: Secondary | ICD-10-CM

## 2012-04-22 DIAGNOSIS — B0229 Other postherpetic nervous system involvement: Secondary | ICD-10-CM | POA: Insufficient documentation

## 2012-04-22 DIAGNOSIS — M25569 Pain in unspecified knee: Secondary | ICD-10-CM | POA: Insufficient documentation

## 2012-04-22 NOTE — Patient Instructions (Addendum)
Continue with walking program and stretching. Ask your neurologist about Lyrica, or Gabapentin for your nerve pain.

## 2012-04-22 NOTE — Progress Notes (Signed)
Subjective:    Patient ID: Peggy Davis, female    DOB: 1961/01/21, 51 y.o.   MRN: [id number]  HPI The patient is a 51 year old female, who presents with fibromyalgia pain mostly in her extremities . The symptoms started one year ago. The patient complains about moderate pain. Patient denies any radiating symptoms. Patient also complains about numbness and tingling in all of her extremities  . She describes the pain as intense and sharp, and stabbing pain radiating from the neck to the occiput and behind both eyes  . Applying heat, rest ,taking flexeril, relaxation, changing positions alleviate the symptoms. Prolonged sitting and standing and lifting , pushing, no sleep aggrevates the symptoms. The patient grades her pain as a 7 /10.  Dr. Roetta Sessions was PM&R MD trigger point injections last about 1 year ago.Not prescribing meds Has had 4 visits with PT relaxation focus, showed some exercises. Mid thoracic paraspinal muscle weakness, has been continuing exercises  R side lumbar shingles with hyper sensitivity since that time.  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 General activity 6 Relation with others 8 Enjoyment of life 7 What TIME of day is your pain at its worst? All Day Sleep (in general) Poor  Pain is worse with: bending, inactivity, standing and some activites Pain improves with: rest, heat/ice, medication and injections Relief from Meds: 6  Mobility walk without assistance  Function disabled: date disabled   Neuro/Psych weakness numbness dizziness  Prior Studies Any changes since last visit?  no  Physicians involved in your care Any changes since last visit?  no   Family History  Problem Relation Age of Onset  . Diabetes Mother   . Cancer Father      History   Social History  . Marital Status: Legally Separated    Spouse Name: N/A    Number of Children: N/A  . Years of Education: N/A   Social History Main Topics  . Smoking status: Never Smoker   . Smokeless tobacco: Never Used  . Alcohol Use: None  . Drug Use: None  . Sexually Active: None   Other Topics Concern  . None   Social History Narrative  . None   Past Surgical History  Procedure Date  . Abdominal hysterectomy   . Brain surgery   . Nasal sinus surgery   . Cholecystectomy   . Appendectomy   . Exploratory on hand    Past Medical History  Diagnosis Date  . Allergy   . Anxiety   . Asthma   . Depression   . Hypertension   . Hyperlipidemia   . Neuromuscular disorder   . Chronic kidney disease   . Arthritis    BP 119/84  Pulse 70  Resp 14  Ht 5\' 5"  (1.651 m)  Wt 130 lb (58.968 kg)  BMI 21.63 kg/m2  SpO2 99%      Review of Systems  HENT: Negative.   Eyes: Negative.   Respiratory: Negative.   Cardiovascular: Negative.   Gastrointestinal: Negative.   Genitourinary: Negative.   Musculoskeletal: Positive for myalgias and arthralgias.  Skin: Negative.   Neurological: Positive for dizziness, weakness and numbness.  Hematological: Negative.   Psychiatric/Behavioral: Negative.        Objective:   Physical Exam  Constitutional: She is  oriented to person, place, and time. She appears well-developed and well-nourished.  HENT:  Head: Normocephalic.  Neck: Neck supple.  Musculoskeletal: She exhibits tenderness.  Neurological: She is alert and oriented to person, place, and time.  Skin: Skin is warm and dry.  Psychiatric: She has a normal mood and affect.    Symmetric normal motor tone is noted throughout, except increased muscle tone in descending trapezius and levator scapulae bilateral. Normal muscle bulk. Muscle testing reveals 5/5 muscle strength of the upper extremity, and 5/5 of the lower extremity. Full range of motion in upper and  lower extremities. ROM of spine is not restricted. Fine motor movements are normal in both hands. Sensory is intact and symmetric to light touch, pinprick and proprioception. DTR in the upper and lower extremity are present and symmetric 1+. No clonus is noted.  Patient arises from chair without difficulty. Narrow based gait with normal arm swing bilateral , able to walk on heels and toes . Tandem walk is stable. No pronator drift. Rhomberg negative.       Assessment & Plan:  1. Chronic pain syndrome multifactorial complicated by multiple drug allergies.  2. fibromyalgia syndrome at this point I think this is causing her left knee pain and probably her right hip pain.  3.myofascial pain syndrome related to Chiari malformation causing mainly trapezius muscle pain has benefited from trigger point injections in the past will resume and schedule every 3 months based on prior history  4. Postherpetic neuralgia right lumbar area, trial Lidoderm patches did not help at all. Advised patient to ask her neurologist whether he would consider Gabapentin or Lyrica. Patient has multiple adverse effects with multiple medications, and states, that her neurologist was considering some of those meds, she will see him soon.   Point injections  Indications myofascial pain syndrome only partially response to medication management and conservative care including physical therapy.  Informed consent was obtained after starting risks and benefits of the patient with patient include bleeding bruising and infection she elects to proceed and has given written consent  Patient placed in a seated position 3 areas on the right and 3 areas on the left were marked. 2 were on the trapezius and 1 was at the insertion of the levator scapulae on each side.  25-gauge 1.5 inch needle used. 1 cc of lidocaine injected into each spot area patient tolerated procedure well. Post procedure instructions given.  Neck stretching exercises were  shown, and also technics to improve posture.  Follow up in 3 month.

## 2012-07-13 ENCOUNTER — Ambulatory Visit: Payer: Medicaid Other | Admitting: Physical Medicine and Rehabilitation

## 2012-07-16 ENCOUNTER — Encounter: Payer: Self-pay | Admitting: Physical Medicine and Rehabilitation

## 2012-07-16 ENCOUNTER — Encounter
Payer: Medicaid Other | Attending: Physical Medicine and Rehabilitation | Admitting: Physical Medicine and Rehabilitation

## 2012-07-16 VITALS — BP 121/71 | HR 74 | Resp 14 | Ht 64.5 in | Wt 130.8 lb

## 2012-07-16 DIAGNOSIS — M531 Cervicobrachial syndrome: Secondary | ICD-10-CM

## 2012-07-16 DIAGNOSIS — I129 Hypertensive chronic kidney disease with stage 1 through stage 4 chronic kidney disease, or unspecified chronic kidney disease: Secondary | ICD-10-CM | POA: Insufficient documentation

## 2012-07-16 DIAGNOSIS — R209 Unspecified disturbances of skin sensation: Secondary | ICD-10-CM | POA: Insufficient documentation

## 2012-07-16 DIAGNOSIS — N189 Chronic kidney disease, unspecified: Secondary | ICD-10-CM | POA: Insufficient documentation

## 2012-07-16 DIAGNOSIS — M25519 Pain in unspecified shoulder: Secondary | ICD-10-CM | POA: Insufficient documentation

## 2012-07-16 DIAGNOSIS — Q048 Other specified congenital malformations of brain: Secondary | ICD-10-CM | POA: Insufficient documentation

## 2012-07-16 DIAGNOSIS — IMO0001 Reserved for inherently not codable concepts without codable children: Secondary | ICD-10-CM

## 2012-07-16 DIAGNOSIS — B0229 Other postherpetic nervous system involvement: Secondary | ICD-10-CM | POA: Insufficient documentation

## 2012-07-16 DIAGNOSIS — M542 Cervicalgia: Secondary | ICD-10-CM

## 2012-07-16 DIAGNOSIS — Z889 Allergy status to unspecified drugs, medicaments and biological substances status: Secondary | ICD-10-CM | POA: Insufficient documentation

## 2012-07-16 DIAGNOSIS — E785 Hyperlipidemia, unspecified: Secondary | ICD-10-CM | POA: Insufficient documentation

## 2012-07-16 DIAGNOSIS — G894 Chronic pain syndrome: Secondary | ICD-10-CM | POA: Insufficient documentation

## 2012-07-16 DIAGNOSIS — M5481 Occipital neuralgia: Secondary | ICD-10-CM

## 2012-07-16 NOTE — Progress Notes (Signed)
Is this my patient? 

## 2012-07-16 NOTE — Progress Notes (Signed)
Subjective:    Patient ID: Peggy Davis, female    DOB: 1961-04-17, 51 y.o.   MRN: [id number]  HPI The patient is a 51 year old female, who presents with fibromyalgia pain mostly in her extremities . The symptoms started one year ago. The patient complains about moderate pain. Patient denies any radiating symptoms. Patient also complains about numbness and tingling in all of her extremities . She describes the pain as intense and sharp, and stabbing pain radiating from the neck to the occiput and behind both eyes . Applying heat, rest ,taking flexeril, relaxation, changing positions alleviate the symptoms. Prolonged sitting and standing and lifting , pushing, no sleep aggrevates the symptoms. The patient grades her pain as a 7 /10.  Dr. Roetta Sessions was PM&R MD trigger point injections last about 1 year ago. Mid thoracic paraspinal muscle weakness, has been continuing exercises  R side lumbar shingles with hyper sensitivity since that time.  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 General activity 9 Relation with others 9 Enjoyment of life 9 What TIME of day is your pain at its worst? evening and night Sleep (in general) Fair  Pain is worse with: unsure and some activites Pain improves with: injections Relief from Meds: 5  Mobility walk without assistance ability to climb steps?  yes do you drive?  yes  Function disabled: date disabled   Neuro/Psych depression anxiety  Prior Studies Any changes since last visit?  no  Physicians involved in your care Any changes since last visit?  no   Family History  Problem Relation Age of Onset  . Diabetes Mother   . Cancer Father    History   Social History  . Marital Status: Legally Separated    Spouse Name: N/A    Number of Children:  N/A  . Years of Education: N/A   Social History Main Topics  . Smoking status: Never Smoker   . Smokeless tobacco: Never Used  . Alcohol Use: None  . Drug Use: None  . Sexually Active: None   Other Topics Concern  . None   Social History Narrative  . None   Past Surgical History  Procedure Date  . Abdominal hysterectomy   . Brain surgery   . Nasal sinus surgery   . Cholecystectomy   . Appendectomy   . Exploratory on hand    Past Medical History  Diagnosis Date  . Allergy   . Anxiety   . Asthma   . Depression   . Hypertension   . Hyperlipidemia   . Neuromuscular disorder   . Chronic kidney disease   . Arthritis    BP 121/71  Pulse 74  Resp 14  Ht 5' 4.5" (1.638 m)  Wt 130 lb 12.8 oz (59.33 kg)  BMI 22.10 kg/m2  SpO2 99%    Review of Systems  Musculoskeletal: Positive for myalgias.  Psychiatric/Behavioral: Positive for dysphoric mood. The patient is nervous/anxious.   All other systems reviewed and are negative.       Objective:   Physical Exam Constitutional: She is oriented to person, place, and time. She appears well-developed and well-nourished.  HENT:  Head: Normocephalic.  Neck: Neck supple.  Musculoskeletal: She exhibits tenderness.  Neurological: She is alert and oriented to person, place, and time.  Skin: Skin is  warm and dry.  Psychiatric: She has a normal mood and affect.   Symmetric normal motor tone is noted throughout, except increased muscle tone in descending trapezius and levator scapulae bilateral. Normal muscle bulk. Muscle testing reveals 5/5 muscle strength of the upper extremity, and 5/5 of the lower extremity. Full range of motion in upper and lower extremities. ROM of spine is not restricted. Fine motor movements are normal in both hands.  Sensory is intact and symmetric to light touch, pinprick and proprioception.  DTR in the upper and lower extremity are present and symmetric 1+. No clonus is noted.  Patient arises from  chair without difficulty. Narrow based gait with normal arm swing bilateral , able to walk on heels and toes . Tandem walk is stable. No pronator drift. Rhomberg negative.        Assessment & Plan:  1. Chronic pain syndrome multifactorial complicated by multiple drug allergies.  2. fibromyalgia syndrome at this point I think this is causing her left knee pain and probably her right hip pain.  3.myofascial pain syndrome related to Chiari malformation causing mainly trapezius muscle pain has benefited from trigger point injections in the past will resume and schedule every 3 months based on prior history  4. Postherpetic neuralgia right lumbar area, trial Lidoderm patches did not help at all. Advised patient to ask her neurologist whether he would consider Gabapentin or Lyrica, her neurologist did not want to prescribe this medication, because of her multiple allergies.   Point injections  Indications myofascial pain syndrome only partially response to medication management and conservative care including physical therapy.  Ordered PT with modalities, mainly for stretching and Posture training, preferable [facility]. Informed consent was obtained after starting risks and benefits of the patient with patient include bleeding bruising and infection she elects to proceed and has given written consent  Patient placed in a seated position 3 areas on the right and 3 areas on the left were marked. 2 were on the trapezius and 1 was at the insertion of the levator scapulae on each side.  25-gauge 1.5 inch needle used. 1 cc of lidocaine injected into each spot area patient tolerated procedure well. Post procedure instructions given.  Neck stretching exercises were shown, and also technics to improve posture.  Follow up in 3 month.

## 2012-07-16 NOTE — Patient Instructions (Signed)
Continue with staying active, try to do some posture exercises, like we talked /demonstrated today. You could read about [facility] posture training in the internet.

## 2012-09-25 ENCOUNTER — Other Ambulatory Visit: Payer: Self-pay | Admitting: Internal Medicine

## 2012-09-28 NOTE — Telephone Encounter (Signed)
No  Need to get fromPCP

## 2012-09-29 NOTE — Telephone Encounter (Signed)
No pcp

## 2012-10-12 ENCOUNTER — Encounter
Payer: Medicaid Other | Attending: Physical Medicine and Rehabilitation | Admitting: Physical Medicine and Rehabilitation

## 2012-10-12 ENCOUNTER — Encounter: Payer: Self-pay | Admitting: Physical Medicine and Rehabilitation

## 2012-10-12 VITALS — BP 135/76 | HR 69 | Resp 14 | Ht 65.0 in | Wt 136.0 lb

## 2012-10-12 DIAGNOSIS — IMO0001 Reserved for inherently not codable concepts without codable children: Secondary | ICD-10-CM

## 2012-10-12 NOTE — Patient Instructions (Signed)
Continue with your walking program. Recommend aquatic exercises in a gym.

## 2012-10-12 NOTE — Progress Notes (Signed)
Subjective:    Patient ID: Peggy Davis, female    DOB: [birth year], 52 y.o.   MRN: [id number]  HPI The patient is a 52 year old female, who presents with fibromyalgia pain mostly in her extremities and neck muscles . The symptoms started one year ago. The patient complains about moderate pain. Patient denies any radiating symptoms. Patient also complains about numbness and tingling in all of her extremities . She describes the pain as intense and sharp, and stabbing pain radiating from the neck to the occiput and behind both eyes . Applying heat, rest ,taking flexeril, relaxation, changing positions alleviate the symptoms. Prolonged sitting and standing and lifting , pushing, no sleep aggrevates the symptoms. The patient grades her pain as a 7 /10.  Dr. Roetta Sessions was PM&R MD trigger point injections last about 1 year ago.  Mid thoracic paraspinal muscle weakness, has been continuing exercises  R side lumbar shingles with hyper sensitivity since that time.  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 General activity 5 Relation with others 5 Enjoyment of life 5 What TIME of day is your pain at its worst? varies Sleep (in general) Fair  Pain is worse with: some activites Pain improves with: medication Relief from Meds: 0  Mobility ability to climb steps?  yes do you drive?  yes  Function disabled: date disabled unknown  Neuro/Psych numbness tingling  Prior Studies x-rays  Physicians involved in your care Any changes since last visit?  yes   Family History  Problem Relation Age of Onset  . Diabetes Mother   . Cancer Father    History   Social History  . Marital Status: Legally Separated    Spouse Name: N/A    Number of Children: N/A  . Years of  Education: N/A   Social History Main Topics  . Smoking status: Never Smoker   . Smokeless tobacco: Never Used  . Alcohol Use: None  . Drug Use: None  . Sexually Active: None   Other Topics Concern  . None   Social History Narrative  . None   Past Surgical History  Procedure Laterality Date  . Abdominal hysterectomy    . Brain surgery    . Nasal sinus surgery    . Cholecystectomy    . Appendectomy    . Exploratory on hand     Past Medical History  Diagnosis Date  . Allergy   . Anxiety   . Asthma   . Depression   . Hypertension   . Hyperlipidemia   . Neuromuscular disorder   . Chronic kidney disease   . Arthritis    BP 135/76  Pulse 69  Resp 14  Ht 5\' 5"  (1.651 m)  Wt 136 lb (61.689 kg)  BMI 22.63 kg/m2  SpO2 98%  LMP 04/14/2006     Review of Systems  Neurological: Positive for numbness.  All other systems reviewed and are negative.       Objective:   Physical Exam Constitutional: She is oriented to person, place, and time. She appears well-developed and well-nourished.  HENT:  Head: Normocephalic.  Neck: Neck supple.  Musculoskeletal: She exhibits tenderness.  Neurological: She is alert and oriented to person, place, and time.  Skin: Skin is warm and dry.  Psychiatric: She has a  normal mood and affect.  Symmetric normal motor tone is noted throughout, except increased muscle tone in descending trapezius and levator scapulae bilateral. Normal muscle bulk. Muscle testing reveals 5/5 muscle strength of the upper extremity, and 5/5 of the lower extremity. Full range of motion in upper and lower extremities. ROM of spine is not restricted. Fine motor movements are normal in both hands.  Sensory is intact and symmetric to light touch, pinprick and proprioception.  DTR in the upper and lower extremity are present and symmetric 1+. No clonus is noted.  Patient arises from chair without difficulty. Narrow based gait with normal arm swing bilateral , able to  walk on heels and toes . Tandem walk is stable. No pronator drift. Rhomberg negative.        Assessment & Plan:  1. Chronic pain syndrome multifactorial complicated by multiple drug allergies.  2. fibromyalgia syndrome at this point I think this is causing her left knee pain and probably her right hip pain.  3.myofascial pain syndrome related to Chiari malformation causing mainly trapezius muscle pain has benefited from trigger point injections in the past will resume and schedule every 3 months based on prior history  4. Postherpetic neuralgia right lumbar area, trial Lidoderm patches did not help at all. Advised patient to ask her neurologist whether he would consider Gabapentin or Lyrica, her neurologist did not want to prescribe this medication, because of her multiple allergies.  Point injections  Indications myofascial pain syndrome only partially response to medication management and conservative care including physical therapy.  Ordered PT with modalities, mainly for stretching and Posture training, preferable [facility] at last visit, insurance does not pay for this, she has to wait until the end of April to be eligible to get 4 more PT visits..  Informed consent was obtained after starting risks and benefits of the patient with patient include bleeding bruising and infection she elects to proceed and has given written consent  Patient placed in a seated position 3 areas on the right and 3 areas on the left were marked. 2 were on the trapezius and 1 was at the insertion of the levator scapulae on each side.  25-gauge 1.5 inch needle used. 1 cc of lidocaine injected into each spot area patient tolerated procedure well. Post procedure instructions given.  Neck stretching exercises were shown, and also technics to improve posture.  Follow up in 3 month.

## 2012-10-12 NOTE — Addendum Note (Signed)
Addended by: Su Monks on: 10/12/2012 02:35 PM   Modules accepted: Level of Service

## 2013-01-06 ENCOUNTER — Telehealth: Payer: Self-pay | Admitting: *Deleted

## 2013-01-06 NOTE — Telephone Encounter (Signed)
Peggy Davis called and said that her last appt with you that you were going to do some research and get back to her and she has never heard anything.

## 2013-01-06 NOTE — Telephone Encounter (Signed)
I guess the patient misunderstood, I said to her I will do some general research about one of her problems until her next visit, which should be soon

## 2013-01-11 ENCOUNTER — Encounter
Payer: Medicaid Other | Attending: Physical Medicine and Rehabilitation | Admitting: Physical Medicine and Rehabilitation

## 2013-01-11 ENCOUNTER — Encounter: Payer: Self-pay | Admitting: Physical Medicine and Rehabilitation

## 2013-01-11 VITALS — BP 124/83 | HR 85 | Resp 14 | Ht 65.0 in | Wt 136.0 lb

## 2013-01-11 DIAGNOSIS — M79609 Pain in unspecified limb: Secondary | ICD-10-CM | POA: Insufficient documentation

## 2013-01-11 DIAGNOSIS — B0229 Other postherpetic nervous system involvement: Secondary | ICD-10-CM | POA: Insufficient documentation

## 2013-01-11 DIAGNOSIS — IMO0001 Reserved for inherently not codable concepts without codable children: Secondary | ICD-10-CM | POA: Insufficient documentation

## 2013-01-11 DIAGNOSIS — G894 Chronic pain syndrome: Secondary | ICD-10-CM | POA: Insufficient documentation

## 2013-01-11 DIAGNOSIS — M542 Cervicalgia: Secondary | ICD-10-CM | POA: Insufficient documentation

## 2013-01-11 DIAGNOSIS — IMO0002 Reserved for concepts with insufficient information to code with codable children: Secondary | ICD-10-CM

## 2013-01-11 DIAGNOSIS — M797 Fibromyalgia: Secondary | ICD-10-CM

## 2013-01-11 DIAGNOSIS — I129 Hypertensive chronic kidney disease with stage 1 through stage 4 chronic kidney disease, or unspecified chronic kidney disease: Secondary | ICD-10-CM | POA: Insufficient documentation

## 2013-01-11 DIAGNOSIS — N189 Chronic kidney disease, unspecified: Secondary | ICD-10-CM | POA: Insufficient documentation

## 2013-01-11 DIAGNOSIS — R209 Unspecified disturbances of skin sensation: Secondary | ICD-10-CM | POA: Insufficient documentation

## 2013-01-11 DIAGNOSIS — Q054 Unspecified spina bifida with hydrocephalus: Secondary | ICD-10-CM

## 2013-01-11 NOTE — Patient Instructions (Signed)
Stay as active as tolerated. 

## 2013-01-11 NOTE — Progress Notes (Signed)
Subjective:    Patient ID: Peggy Davis, female    DOB: 03-25-1961, 52 y.o.   MRN: [id number]  HPI The patient is a 52 year old female, who presents with fibromyalgia pain mostly in her extremities and neck muscles . The symptoms started one year ago. The patient complains about moderate pain. Patient denies any radiating symptoms. Patient also complains about numbness and tingling in all of her extremities . She describes the pain as intense and sharp, and stabbing pain radiating from the neck to the occiput and behind both eyes . Applying heat, rest ,taking flexeril, relaxation, changing positions alleviate the symptoms. Prolonged sitting and standing and lifting , pushing, no sleep aggrevates the symptoms. The patient grades her pain as a 7 /10.  Dr. Roetta Sessions was PM&R MD trigger point injections last about 1 year ago.  Mid thoracic paraspinal muscle weakness, has been continuing exercises  R side lumbar shingles with hyper sensitivity since that time.  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 General activity 8 Relation with others 8 Enjoyment of life 8 What TIME of day is your pain at its worst? varies Sleep (in general) Fair  Pain is worse with: walking, standing and unsure Pain improves with: rest, medication and injections Relief from Meds: 5  Mobility walk without assistance ability to climb steps?  yes do you drive?  yes  Function Do you have any goals in this area?  no  Neuro/Psych weakness numbness tingling dizziness depression anxiety  Prior Studies Any changes since last visit?  no  Physicians involved in your care Any changes since last visit?  no   Family History  Problem Relation Age of Onset  . Diabetes Mother   . Cancer Father     History   Social History  . Marital Status: Legally Separated    Spouse Name: N/A    Number of Children: N/A  . Years of Education: N/A   Social History Main Topics  . Smoking status: Never Smoker   . Smokeless tobacco: Never Used  . Alcohol Use: None  . Drug Use: None  . Sexually Active: None   Other Topics Concern  . None   Social History Narrative  . None   Past Surgical History  Procedure Laterality Date  . Abdominal hysterectomy    . Brain surgery    . Nasal sinus surgery    . Cholecystectomy    . Appendectomy    . Exploratory on hand     Past Medical History  Diagnosis Date  . Allergy   . Anxiety   . Asthma   . Depression   . Hypertension   . Hyperlipidemia   . Neuromuscular disorder   . Chronic kidney disease   . Arthritis    BP 124/83  Pulse 85  Resp 14  Ht 5\' 5"  (1.651 m)  Wt 136 lb (61.689 kg)  BMI 22.63 kg/m2  SpO2 98%  LMP 04/14/2006     Review of Systems  Constitutional: Positive for appetite change.  Neurological: Positive for dizziness, weakness and numbness.  Psychiatric/Behavioral: Positive for dysphoric mood. The patient is nervous/anxious.   All other systems reviewed and are negative.       Objective:   Physical Exam Constitutional: She is oriented to person, place, and time.  She appears well-developed and well-nourished.  HENT:  Head: Normocephalic.  Neck: Neck supple.  Musculoskeletal: She exhibits tenderness.  Neurological: She is alert and oriented to person, place, and time.  Skin: Skin is warm and dry.  Psychiatric: She has a normal mood and affect.  Symmetric normal motor tone is noted throughout, except increased muscle tone in descending trapezius and levator scapulae bilateral. Normal muscle bulk. Muscle testing reveals 5/5 muscle strength of the upper extremity, and 5/5 of the lower extremity. Full range of motion in upper and lower extremities. ROM of spine is not restricted. Fine motor movements are normal  in both hands.  Sensory is intact and symmetric to light touch, pinprick and proprioception.  DTR in the upper and lower extremity are present and symmetric 1+. No clonus is noted.  Patient arises from chair without difficulty. Narrow based gait with normal arm swing bilateral , able to walk on heels and toes . Tandem walk is stable. No pronator drift. Rhomberg negative.        Assessment & Plan:  1. Chronic pain syndrome multifactorial complicated by multiple drug allergies.  2. fibromyalgia syndrome at this point I think this is causing her left knee pain and probably her right hip pain.  3.myofascial pain syndrome related to Chiari malformation causing mainly trapezius muscle pain has benefited from trigger point injections in the past will resume and schedule every 3 months based on prior history  4. Postherpetic neuralgia right lumbar area, trial Lidoderm patches did not help at all. Advised patient to ask her neurologist whether he would consider Gabapentin or Lyrica, her neurologist did not want to prescribe this medication, because of her multiple allergies.  Point injections  Indications myofascial pain syndrome only partially response to medication management and conservative care including physical therapy.  Ordered PT with modalities, mainly for stretching and Posture training, preferable [facility] at last visit, insurance does not pay for this, she has to wait until the end of April to be eligible to get 4 more PT visits..  Informed consent was obtained after starting risks and benefits of the patient with patient include bleeding bruising and infection she elects to proceed and has given written consent  Patient placed in a seated position 2 areas on the right and 2 areas on the left were marked. 1 was on the trapezius and 1 was at the insertion of the levator scapulae on each side.  25-gauge 1.5 inch needle used. 1 cc of lidocaine injected into each spot area patient tolerated  procedure well. Post procedure instructions given.  Neck stretching exercises were shown, and also technics to improve posture.  Follow up in 3 month.

## 2013-04-13 ENCOUNTER — Encounter
Payer: Medicaid Other | Attending: Physical Medicine and Rehabilitation | Admitting: Physical Medicine and Rehabilitation

## 2013-04-13 ENCOUNTER — Encounter: Payer: Self-pay | Admitting: Physical Medicine and Rehabilitation

## 2013-04-13 VITALS — BP 132/82 | HR 81 | Resp 14 | Ht 65.0 in | Wt 139.0 lb

## 2013-04-13 DIAGNOSIS — B0229 Other postherpetic nervous system involvement: Secondary | ICD-10-CM | POA: Insufficient documentation

## 2013-04-13 DIAGNOSIS — IMO0001 Reserved for inherently not codable concepts without codable children: Secondary | ICD-10-CM | POA: Insufficient documentation

## 2013-04-13 DIAGNOSIS — E785 Hyperlipidemia, unspecified: Secondary | ICD-10-CM | POA: Insufficient documentation

## 2013-04-13 DIAGNOSIS — G894 Chronic pain syndrome: Secondary | ICD-10-CM | POA: Insufficient documentation

## 2013-04-13 DIAGNOSIS — I129 Hypertensive chronic kidney disease with stage 1 through stage 4 chronic kidney disease, or unspecified chronic kidney disease: Secondary | ICD-10-CM | POA: Insufficient documentation

## 2013-04-13 DIAGNOSIS — N189 Chronic kidney disease, unspecified: Secondary | ICD-10-CM | POA: Insufficient documentation

## 2013-04-13 NOTE — Progress Notes (Signed)
Subjective:    Patient ID: Peggy Davis, female    DOB: 01/03/1961, 52 y.o.   MRN: [id number]  HPI The patient is a 52 year old female, who presents with fibromyalgia pain mostly in her extremities and neck muscles . The symptoms started one year ago. The patient complains about moderate pain. Patient denies any radiating symptoms. Patient also complains about numbness and tingling in all of her extremities . She describes the pain as intense and sharp, and stabbing pain radiating from the neck to the occiput and behind both eyes . Applying heat, rest ,taking flexeril, relaxation, changing positions alleviate the symptoms. Prolonged sitting and standing and lifting , pushing, no sleep aggrevates the symptoms. The patient grades her pain as a 6 /10.  Dr. Roetta Sessions was PM&R MD trigger point injections last about 1 year ago.    R side lumbar shingles with hyper sensitivity since that time.  Numbness and tingling in feet since sub occipital decompression from Chiari malformation  Pain Inventory Average Pain 8 Pain Right Now 6 My pain is sharp and burning  In the last 24 hours, has pain interfered with the following? General activity 7 Relation with others 7 Enjoyment of life 7 What TIME of day is your pain at its worst? morning, day, evening and sometimes wakes up at night Sleep (in general) Poor  Pain is worse with: walking and standing Pain improves with: rest, medication and injections Relief from Meds: 5  Mobility walk without assistance how many minutes can you walk? 60 ability to climb steps?  yes do you drive?  yes  Function Do you have any goals in this area?  no  Neuro/Psych weakness numbness tingling dizziness depression anxiety  Prior Studies Any changes since last visit?  no  Physicians involved in your care Any changes since last visit?  no   Family History  Problem Relation Age of Onset  . Diabetes Mother   . Cancer Father    History   Social History   . Marital Status: Legally Separated    Spouse Name: N/A    Number of Children: N/A  . Years of Education: N/A   Social History Main Topics  . Smoking status: Never Smoker   . Smokeless tobacco: Never Used  . Alcohol Use: None  . Drug Use: None  . Sexual Activity: None   Other Topics Concern  . None   Social History Narrative  . None   Past Surgical History  Procedure Laterality Date  . Abdominal hysterectomy    . Brain surgery    . Nasal sinus surgery    . Cholecystectomy    . Appendectomy    . Exploratory on hand     Past Medical History  Diagnosis Date  . Allergy   . Anxiety   . Asthma   . Depression   . Hypertension   . Hyperlipidemia   . Neuromuscular disorder   . Chronic kidney disease   . Arthritis    BP 132/82  Pulse 81  Resp 14  Ht 5\' 5"  (1.651 m)  Wt 139 lb (63.05 kg)  BMI 23.13 kg/m2  SpO2 97%  LMP 04/14/2006     Review of Systems  HENT: Positive for neck pain.   Musculoskeletal: Positive for myalgias and arthralgias.  Neurological: Positive for dizziness, weakness and numbness.  Psychiatric/Behavioral: Positive for dysphoric mood. The patient is nervous/anxious.   All other systems reviewed and are negative.       Objective:  Physical Exam Constitutional: She is oriented to person, place, and time. She appears well-developed and well-nourished.  HENT:  Head: Normocephalic.  Neck: Neck supple.  Musculoskeletal: She exhibits tenderness.  Neurological: She is alert and oriented to person, place, and time.  Skin: Skin is warm and dry.  Psychiatric: She has a normal mood and affect.  Symmetric normal motor tone is noted throughout, except increased muscle tone in descending trapezius and levator scapulae bilateral. Normal muscle bulk. Muscle testing reveals 5/5 muscle strength of the upper extremity, and 5/5 of the lower extremity. Full range of motion in upper and lower extremities. ROM of spine is not restricted. Fine motor movements  are normal in both hands.  Sensory is intact and symmetric to light touch, pinprick and proprioception.  DTR in the upper and lower extremity are present and symmetric 1+. No clonus is noted.  Patient arises from chair without difficulty. Narrow based gait with normal arm swing bilateral , able to walk on heels and toes . Tandem walk is stable. No pronator drift. Rhomberg negative.        Assessment & Plan:  1. Chronic pain syndrome multifactorial complicated by multiple drug allergies.  2. fibromyalgia syndrome at this point I think this is causing her left knee pain and probably her right hip pain.  3.myofascial pain syndrome related to Chiari malformation causing mainly trapezius muscle pain has benefited from trigger point injections in the past will resume and schedule every 3 months based on prior history  4. Postherpetic neuralgia right lumbar area, trial Lidoderm patches did not help at all. Advised patient to ask her neurologist whether he would consider Gabapentin or Lyrica, her neurologist did not want to prescribe this medication, because of her multiple allergies.  TPI,  Indications myofascial pain syndrome only partially response to medication management and conservative care including physical therapy.   Informed consent was obtained after starting risks and benefits of the patient with patient include bleeding bruising and infection she elects to proceed and has given written consent  Patient placed in a seated position 2 areas on the right and 2 areas on the left were marked. 1 was on the trapezius and 1 was at the insertion of the levator scapulae on each side.  25-gauge 1.5 inch needle used. 1 cc of lidocaine injected into each spot area patient tolerated procedure well. Post procedure instructions given.  Neck stretching exercises were shown, and also technics to improve posture.  Ordered PT with modalities, mainly for stretching and Posture training, preferable [facility] at  last visit,  She has not done this yet, her daughter has a risk pregnancy, and the patient is helping her out. Consider PT in the future. Follow up in 3 month.

## 2013-04-13 NOTE — Patient Instructions (Signed)
Continue with stretching exercises and try to keep a good posture.

## 2013-06-16 ENCOUNTER — Encounter: Payer: Self-pay | Admitting: Physical Medicine and Rehabilitation

## 2013-06-16 ENCOUNTER — Encounter
Payer: Medicaid Other | Attending: Physical Medicine and Rehabilitation | Admitting: Physical Medicine and Rehabilitation

## 2013-06-16 VITALS — BP 142/79 | HR 80 | Resp 14 | Ht 65.0 in | Wt 141.4 lb

## 2013-06-16 DIAGNOSIS — E785 Hyperlipidemia, unspecified: Secondary | ICD-10-CM | POA: Insufficient documentation

## 2013-06-16 DIAGNOSIS — M797 Fibromyalgia: Secondary | ICD-10-CM

## 2013-06-16 DIAGNOSIS — B0229 Other postherpetic nervous system involvement: Secondary | ICD-10-CM | POA: Insufficient documentation

## 2013-06-16 DIAGNOSIS — N189 Chronic kidney disease, unspecified: Secondary | ICD-10-CM | POA: Insufficient documentation

## 2013-06-16 DIAGNOSIS — I129 Hypertensive chronic kidney disease with stage 1 through stage 4 chronic kidney disease, or unspecified chronic kidney disease: Secondary | ICD-10-CM | POA: Insufficient documentation

## 2013-06-16 DIAGNOSIS — IMO0001 Reserved for inherently not codable concepts without codable children: Secondary | ICD-10-CM | POA: Insufficient documentation

## 2013-06-16 DIAGNOSIS — G894 Chronic pain syndrome: Secondary | ICD-10-CM | POA: Insufficient documentation

## 2013-06-16 NOTE — Patient Instructions (Signed)
Try to find some time for relaxation if possible.

## 2013-06-16 NOTE — Progress Notes (Signed)
Subjective:    Patient ID: Peggy Davis, female    DOB: 02-Sep-1960, 52 y.o.   MRN: [id number]  HPI The patient is a 52 year old female, who presents with fibromyalgia pain mostly in her extremities and neck muscles . The symptoms started one year ago. The patient complains about moderate pain. Patient denies any radiating symptoms. Patient also complains about numbness and tingling in all of her extremities . She describes the pain as intense and sharp, and stabbing pain radiating from the neck to the occiput and behind both eyes . Applying heat, rest ,taking flexeril, relaxation, changing positions alleviate the symptoms. Prolonged sitting and standing and lifting , pushing, no sleep aggrevates the symptoms. The patient grades her pain as a 6 /10.  Dr. Roetta Sessions was PM&R MD trigger point injections last about 1 year ago.  R side lumbar shingles with hyper sensitivity since that time.  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 General activity 7 Relation with others 9 Enjoyment of life 7 What TIME of day is your pain at its worst? morning, day, evening Sleep (in general) Fair  Pain is worse with: walking and standing Pain improves with: rest, medication and injections Relief from Meds: 5  Mobility walk without assistance how many minutes can you walk? 60 ability to climb steps?  yes do you drive?  yes  Function Do you have any goals in this area?  no  Neuro/Psych weakness numbness tingling dizziness depression anxiety  Prior Studies Any changes since last visit?  no  Physicians involved in your care Any changes since last visit?  no   Family History  Problem Relation Age of Onset  . Diabetes Mother   . Cancer Father    History   Social History  .  Marital Status: Legally Separated    Spouse Name: N/A    Number of Children: N/A  . Years of Education: N/A   Social History Main Topics  . Smoking status: Never Smoker   . Smokeless tobacco: Never Used  . Alcohol Use: None  . Drug Use: None  . Sexual Activity: None   Other Topics Concern  . None   Social History Narrative  . None   Past Surgical History  Procedure Laterality Date  . Abdominal hysterectomy    . Brain surgery    . Nasal sinus surgery    . Cholecystectomy    . Appendectomy    . Exploratory on hand     Past Medical History  Diagnosis Date  . Allergy   . Anxiety   . Asthma   . Depression   . Hypertension   . Hyperlipidemia   . Neuromuscular disorder   . Chronic kidney disease   . Arthritis    BP 142/79  Pulse 80  Resp 14  Ht 5\' 5"  (1.651 m)  Wt 141 lb 6.4 oz (64.139 kg)  BMI 23.53 kg/m2  SpO2 96%  LMP 04/14/2006      Review of Systems  Musculoskeletal: Positive for myalgias.  Neurological: Positive for dizziness, weakness and numbness.       Tingling  Psychiatric/Behavioral: Positive for dysphoric mood. The patient is nervous/anxious.   All other systems reviewed and are negative.       Objective:   Physical Exam Constitutional: She  is oriented to person, place, and time. She appears well-developed and well-nourished.  HENT:  Head: Normocephalic.  Neck: Neck supple.  Musculoskeletal: She exhibits tenderness.  Neurological: She is alert and oriented to person, place, and time.  Skin: Skin is warm and dry.  Psychiatric: She has a normal mood and affect.  Symmetric normal motor tone is noted throughout, except increased muscle tone in descending trapezius and levator scapulae bilateral. Normal muscle bulk. Muscle testing reveals 5/5 muscle strength of the upper extremity, and 5/5 of the lower extremity. Full range of motion in upper and lower extremities. ROM of spine is not restricted. Fine motor movements are normal in both hands.   Sensory is intact and symmetric to light touch, pinprick and proprioception.  DTR in the upper and lower extremity are present and symmetric 1+. No clonus is noted.  Patient arises from chair without difficulty. Narrow based gait with normal arm swing bilateral , able to walk on heels and toes . Tandem walk is stable. No pronator drift. Rhomberg negative.        Assessment & Plan:  1. Chronic pain syndrome multifactorial complicated by multiple drug allergies.  2. fibromyalgia syndrome at this point I think this is causing her left knee pain and probably her right hip pain.  3.myofascial pain syndrome related to Chiari malformation causing mainly trapezius muscle pain has benefited from trigger point injections in the past will resume and schedule every 3 months based on prior history  4. Postherpetic neuralgia right lumbar area, trial Lidoderm patches did not help at all. Advised patient to ask her neurologist whether he would consider Gabapentin or Lyrica, her neurologist did not want to prescribe this medication, because of her multiple allergies.  TPI,  Indications myofascial pain syndrome only partially response to medication management and conservative care including physical therapy.  Informed consent was obtained after starting risks and benefits of the patient with patient include bleeding bruising and infection she elects to proceed and has given written consent  Patient placed in a seated position 2 areas on the right and 2 areas on the left were marked. 1 was on the trapezius and 1 was at the insertion of the levator scapulae on each side.  25-gauge 1.5 inch needle used. 1 cc of lidocaine injected into each spot area patient tolerated procedure well. Post procedure instructions given.  Neck stretching exercises were shown, and also technics to improve posture.  Ordered PT with modalities, mainly for stretching and Posture training, preferable [facility] at last visit, She has not done  this yet, her daughter has a risk pregnancy, and the patient is still helping her out, she is traveling to [place] back and [place] and stays for several weeks at a time to help her daugther. Consider PT in the future.  Follow up in 3 month.

## 2013-06-17 ENCOUNTER — Encounter: Payer: Medicaid Other | Admitting: Physical Medicine and Rehabilitation

## 2013-09-14 ENCOUNTER — Ambulatory Visit: Payer: Medicaid Other | Admitting: Physical Medicine & Rehabilitation

## 2013-10-01 ENCOUNTER — Encounter: Payer: Medicaid Other | Attending: Physical Medicine & Rehabilitation

## 2013-10-01 ENCOUNTER — Ambulatory Visit (HOSPITAL_BASED_OUTPATIENT_CLINIC_OR_DEPARTMENT_OTHER): Payer: Medicaid Other | Admitting: Physical Medicine & Rehabilitation

## 2013-10-01 ENCOUNTER — Encounter: Payer: Self-pay | Admitting: Physical Medicine & Rehabilitation

## 2013-10-01 VITALS — BP 123/73 | HR 67 | Resp 14 | Ht 65.0 in | Wt 134.0 lb

## 2013-10-01 DIAGNOSIS — G8929 Other chronic pain: Secondary | ICD-10-CM | POA: Insufficient documentation

## 2013-10-01 DIAGNOSIS — M797 Fibromyalgia: Secondary | ICD-10-CM

## 2013-10-01 DIAGNOSIS — IMO0001 Reserved for inherently not codable concepts without codable children: Secondary | ICD-10-CM

## 2013-10-01 MED ORDER — CYCLOBENZAPRINE HCL 10 MG PO TABS: 10.0000 mg | ORAL_TABLET | Freq: Three times a day (TID) | ORAL | Status: DC | PRN

## 2013-10-01 NOTE — Progress Notes (Signed)
Subjective:    Patient ID: Peggy ProvidenceKathiluise Demasi, female    DOB: [birth year], 53 y.o.   MRN: [id number]  HPI Patient's primary physician is recommending decrease doses cyclobenzaprine to each bedtime. Patient states she tried this but was not as comfortable during the day with her muscle spasm type pain No new problems since last visit in December 2013. Pain Inventory Average Pain 8 Pain Right Now 6 My pain is intermittent and aching  In the last 24 hours, has pain interfered with the following? General activity 7 Relation with others 7 Enjoyment of life 7 What TIME of day is your pain at its worst? evening, night Sleep (in general) Fair  Pain is worse with: other Pain improves with: injections and sleep Relief from Meds: no pain meds  Mobility walk without assistance ability to climb steps?  yes do you drive?  yes transfers alone  Function Do you have any goals in this area?  yes  Neuro/Psych weakness numbness tingling dizziness depression anxiety  Prior Studies Any changes since last visit?  no  Physicians involved in your care Any changes since last visit?  no   Family History  Problem Relation Age of Onset  . Diabetes Mother   . Cancer Father    History   Social History  . Marital Status: Legally Separated    Spouse Name: N/A    Number of Children: N/A  . Years of Education: N/A   Social History Main Topics  . Smoking status: Never Smoker   . Smokeless tobacco: Never Used  . Alcohol Use: None  . Drug Use: None  . Sexual Activity: None   Other Topics Concern  . None   Social History Narrative  . None   Past Surgical History  Procedure Laterality Date  . Abdominal hysterectomy    . Brain surgery    . Nasal sinus surgery    . Cholecystectomy    . Appendectomy    . Exploratory on hand     Past Medical History  Diagnosis Date  . Allergy   . Anxiety   . Asthma   . Depression   . Hypertension   . Hyperlipidemia   . Neuromuscular  disorder   . Chronic kidney disease   . Arthritis    BP 123/73  Pulse 67  Resp 14  Ht 5\' 5"  (1.651 m)  Wt 134 lb (60.782 kg)  BMI 22.30 kg/m2  SpO2 100%  LMP 04/14/2006  Opioid Risk Score:   Fall Risk Score: Low Fall Risk (0-5 points) (pt educated on fall, pt declined brochure)    Review of Systems  Musculoskeletal: Positive for myalgias.  Neurological: Positive for dizziness, weakness and numbness.       Tingling  Psychiatric/Behavioral: Positive for dysphoric mood. The patient is nervous/anxious.   All other systems reviewed and are negative.       Objective:   Physical Exam  General no acute distress Mood and affect are appropriate Upper extremity strength is normal Gait without deviation Tenderness to palpation bilateral upper trap as well as upper medial scapular border No evidence of atrophy Posture is good      Assessment & Plan:  1.  Chronic neck pain, hx of chiari malformation with decompression as well as fibromyalgia and myofascial pain.  Discussed her pain generators, discussed flexeril dose and injections Recommend increase cyclobenzaprine 10 mg 3 times per day  Recommend continued activity, exercise, walking, postural exercises  Trigger Point Injection  Indication: Bilateral trap and  levator scap Myofascial pain not relieved by medication management and other conservative care.  Informed consent was obtained after describing risk and benefits of the procedure with the patient, this includes bleeding, bruising, infection and medication side effects.  The patient wishes to proceed and has given written consent.  The patient was placed in a sitting position.  The upper trap area was marked and prepped with Betadine.  It was entered with a 25-gauge 1-1/2 inch needle and 1 mL of 1% lidocaine was injected into each of 4 trigger points, after negative draw back for blood.  The patient tolerated the procedure well.  Post procedure instructions were given.

## 2013-10-01 NOTE — Patient Instructions (Signed)
Myofascial Pain Syndrome  Myofascial pain syndrome is a pain disorder. This pain may be felt in the muscles. It may come and go. Myofascial pain syndrome always has trigger or tender points in the muscle that will cause pain when pressed.   CAUSES  Myofascial pain may be caused by injuries, especially auto accidents, or by overuse of certain muscles. Typically the pain is long lasting. It is made worse by overuse of the involved muscles, emotional distress, and by cold, damp weather. Myofascial pain syndrome often develops in patients whose response to stress is an increase in muscle tone, and is seen in greater frequency in patients with pre-existing tension headaches.  SYMPTOMS   Myofascial pain syndrome causes a wide variety of symptoms. You may see tight ropy bands of muscle. Problems may also include aching, cramping, burning, numbness, tingling, and other uncomfortable sensations in muscular areas. It most commonly affects the neck, upper back, and shoulder areas. Pain often radiates into the arms and hands.   TREATMENT  Treatment includes resting the affected muscular area and applying ice packs to reduce spasm and pain. Trigger point injection, is a valuable initial therapy. This therapy is an injection of local anesthetic directly into the trigger point. Trigger points are often present at the source of pain. Pain relief following injection confirms the diagnosis of myofascial pain syndrome. Fairly vigorous therapy can be carried out during the pain-free period after each injection. Stretching exercises to loosen up the muscles are also useful. Transcutaneous electrical nerve stimulation (TENS) may provide relief from pain. TENS is the use of electric current produced by a device to stimulate the nerves. Ultrasound therapy applied directly over the affected muscle may also provide pain relief. Anti-inflammatory pain medicine can be helpful. Symptoms will gradually improve over a period of weeks to months  with proper treatment.  HOME CARE INSTRUCTIONS  Call your caregiver for follow-up care as recommended.   SEEK MEDICAL CARE IF:   Your pain is severe and not helped with medications.  Document Released: 08/22/2004 Document Revised: 10/07/2011 Document Reviewed: 08/31/2010  ExitCare® Patient Information ©2014 [employer].

## 2013-12-27 ENCOUNTER — Ambulatory Visit: Payer: Medicaid Other | Admitting: Physical Medicine & Rehabilitation

## 2013-12-27 ENCOUNTER — Encounter: Payer: Medicaid Other | Attending: Physical Medicine & Rehabilitation

## 2013-12-27 DIAGNOSIS — G8929 Other chronic pain: Secondary | ICD-10-CM | POA: Insufficient documentation

## 2013-12-27 DIAGNOSIS — IMO0001 Reserved for inherently not codable concepts without codable children: Secondary | ICD-10-CM | POA: Insufficient documentation

## 2013-12-31 ENCOUNTER — Ambulatory Visit (HOSPITAL_BASED_OUTPATIENT_CLINIC_OR_DEPARTMENT_OTHER): Payer: Medicaid Other | Admitting: Physical Medicine & Rehabilitation

## 2013-12-31 ENCOUNTER — Encounter: Payer: Self-pay | Admitting: Physical Medicine & Rehabilitation

## 2013-12-31 ENCOUNTER — Ambulatory Visit: Payer: Medicaid Other

## 2013-12-31 ENCOUNTER — Encounter: Payer: Medicaid Other | Attending: Physical Medicine & Rehabilitation

## 2013-12-31 VITALS — BP 129/89 | HR 88 | Resp 14 | Wt 131.0 lb

## 2013-12-31 DIAGNOSIS — IMO0001 Reserved for inherently not codable concepts without codable children: Secondary | ICD-10-CM

## 2013-12-31 DIAGNOSIS — Z5189 Encounter for other specified aftercare: Secondary | ICD-10-CM | POA: Diagnosis present

## 2013-12-31 DIAGNOSIS — M797 Fibromyalgia: Secondary | ICD-10-CM

## 2013-12-31 DIAGNOSIS — M7918 Myalgia, other site: Secondary | ICD-10-CM

## 2013-12-31 MED ORDER — TRAMADOL-ACETAMINOPHEN 37.5-325 MG PO TABS: 1.0000 | ORAL_TABLET | Freq: Four times a day (QID) | ORAL | Status: DC | PRN

## 2013-12-31 NOTE — Patient Instructions (Signed)
May benefit from acupuncture once a week  Use tramadol on as needed basis, it is sufficiently different from oxy and hydrocodone as well as morphine to make allergic rxn unlikely

## 2013-12-31 NOTE — Progress Notes (Signed)
Subjective:    Patient ID: Peggy Davis, female    DOB: 04-27-1961, 53 y.o.   MRN: [id number]  HPI Recurrence of upper back pain, trigger point last about one month Takes ambien for sleep Takes 1.5 tablet of 50mg  Zoloft per day divided Flexeril 10mg  TID Pain Inventory Average Pain 10 Pain Right Now 10 My pain is constant, sharp, burning, dull, stabbing, tingling and aching  In the last 24 hours, has pain interfered with the following? General activity 10 Relation with others 10 Enjoyment of life 10 What TIME of day is your pain at its worst? all Sleep (in general) Poor  Pain is worse with: some activites Pain improves with: rest Relief from Meds: 0  Mobility walk without assistance  Function disabled: date disabled . I need assistance with the following:  varies for none to all  Neuro/Psych numbness tingling trouble walking spasms dizziness depression  Prior Studies Any changes since last visit?  no  Physicians involved in your care Any changes since last visit?  no   Family History  Problem Relation Age of Onset  . Diabetes Mother   . Cancer Father    History   Social History  . Marital Status: Legally Separated    Spouse Name: N/A    Number of Children: N/A  . Years of Education: N/A   Social History Main Topics  . Smoking status: Never Smoker   . Smokeless tobacco: Never Used  . Alcohol Use: None  . Drug Use: None  . Sexual Activity: None   Other Topics Concern  . None   Social History Narrative  . None   Past Surgical History  Procedure Laterality Date  . Abdominal hysterectomy    . Brain surgery    . Nasal sinus surgery    . Cholecystectomy    . Appendectomy    . Exploratory on hand     Past Medical History  Diagnosis Date  . Allergy   . Anxiety   . Asthma   . Depression   . Hypertension   . Hyperlipidemia   . Neuromuscular disorder   . Chronic kidney disease   . Arthritis    BP 129/89  Pulse 88  Resp 14  Wt  131 lb (59.421 kg)  SpO2 99%  LMP 04/14/2006  Opioid Risk Score:   Fall Risk Score: Low Fall Risk (0-5 points) (educated and handout for fall prevention in the home given at previous visit)  Review of Systems  Musculoskeletal:       Spasms  Neurological: Positive for dizziness, weakness and numbness.       Tingling  Psychiatric/Behavioral: Positive for dysphoric mood.  All other systems reviewed and are negative.      Objective:   Physical Exam  Nursing note and vitals reviewed. Constitutional: She is oriented to person, place, and time. She appears well-developed and well-nourished.  HENT:  Head: Normocephalic and atraumatic.  Eyes: Conjunctivae and EOM are normal. Pupils are equal, round, and reactive to light.  Neck: Normal range of motion.  Musculoskeletal:       Cervical back: She exhibits tenderness. She exhibits normal range of motion.  Tenderness bilateral trapezius, bilateral levator scapula, bilateral infraspinatus Negative Spurling's test  Midline Upper cervical healed incision  Neurological: She is alert and oriented to person, place, and time. She has normal strength and normal reflexes. No sensory deficit. Coordination and gait normal.  Psychiatric: She has a normal mood and affect.  Assessment & Plan:  . Chronic neck pain, hx of chiari malformation with decompression as well as fibromyalgia and myofascial pain. Discussed her pain generators, discussed flexeril dose and injections  Recommend increase cyclobenzaprine 10 mg 3 times per day  Recommend continued activity, exercise, walking, postural exercises  Patient may benefit from acupuncture once per week Trial of Ultracet 1 tablet every 6 hours as needed on her flareup days. 30 tablets per month should suffice. Does have allergy to oxycodone hydrocodone but did tolerate fentanyl during colonoscopy procedure. Do not anticipate any cross reaction with the tramadol  RTC 3 months  Trigger Point  Injection  Indication: Bilateral trap, infraspinatus and levator scap Myofascial pain not relieved by medication management and other conservative care.  Informed consent was obtained after describing risk and benefits of the procedure with the patient, this includes bleeding, bruising, infection and medication side effects. The patient wishes to proceed and has given written consent. The patient was placed in a sitting position. The upper trap area was marked and prepped with Betadine. It was entered with a 25-gauge 1-1/2 inch needle and 1/2 mL of 1% lidocaine was injected into each of 4 trigger points, after negative draw back for blood. The patient tolerated the procedure well. Post procedure instructions were given.

## 2014-04-05 ENCOUNTER — Ambulatory Visit (HOSPITAL_BASED_OUTPATIENT_CLINIC_OR_DEPARTMENT_OTHER): Payer: Medicaid Other | Admitting: Physical Medicine & Rehabilitation

## 2014-04-05 ENCOUNTER — Encounter: Payer: Self-pay | Admitting: Physical Medicine & Rehabilitation

## 2014-04-05 ENCOUNTER — Encounter: Payer: Medicaid Other | Attending: Physical Medicine & Rehabilitation

## 2014-04-05 VITALS — BP 124/72 | HR 72 | Resp 14 | Wt 140.4 lb

## 2014-04-05 DIAGNOSIS — IMO0001 Reserved for inherently not codable concepts without codable children: Secondary | ICD-10-CM | POA: Diagnosis not present

## 2014-04-05 DIAGNOSIS — Z5189 Encounter for other specified aftercare: Secondary | ICD-10-CM | POA: Insufficient documentation

## 2014-04-05 DIAGNOSIS — M797 Fibromyalgia: Secondary | ICD-10-CM

## 2014-04-05 DIAGNOSIS — M7918 Myalgia, other site: Secondary | ICD-10-CM

## 2014-04-05 MED ORDER — CYCLOBENZAPRINE HCL 10 MG PO TABS
10.0000 mg | ORAL_TABLET | Freq: Three times a day (TID) | ORAL | Status: DC | PRN
Start: 2014-04-05 — End: 2015-03-20

## 2014-04-05 NOTE — Progress Notes (Signed)
   Subjective:    Patient ID: Peggy Davis, female    DOB: 1961/02/08, 53 y.o.   MRN: [id number]  HPI Neck and upper back pain Discussed exercise such as-year-old, Pilates, Taiji, currently doing walking Wants trigger point inj today Pain Inventory Average Pain 8 Pain Right Now 6 My pain is constant  In the last 24 hours, has pain interfered with the following? General activity 10 Relation with others 10 Enjoyment of life 10 What TIME of day is your pain at its worst? all Sleep (in general) Poor  Pain is worse with: some activites Pain improves with: rest, medication and injections Relief from Meds: 8  Mobility walk without assistance  Function disabled: date disabled .  Neuro/Psych numbness tingling dizziness depression  Prior Studies Any changes since last visit?  no  Physicians involved in your care Any changes since last visit?  no   Family History  Problem Relation Age of Onset  . Diabetes Mother   . Cancer Father    History   Social History  . Marital Status: Legally Separated    Spouse Name: N/A    Number of Children: N/A  . Years of Education: N/A   Social History Main Topics  . Smoking status: Never Smoker   . Smokeless tobacco: Never Used  . Alcohol Use: None  . Drug Use: None  . Sexual Activity: None   Other Topics Concern  . None   Social History Narrative  . None   Past Surgical History  Procedure Laterality Date  . Abdominal hysterectomy    . Brain surgery    . Nasal sinus surgery    . Cholecystectomy    . Appendectomy    . Exploratory on hand     Past Medical History  Diagnosis Date  . Allergy   . Anxiety   . Asthma   . Depression   . Hypertension   . Hyperlipidemia   . Neuromuscular disorder   . Chronic kidney disease   . Arthritis    BP 124/72  Pulse 72  Resp 14  Wt 140 lb 6.4 oz (63.685 kg)  SpO2 97%  LMP 04/14/2006  Opioid Risk Score:   Fall Risk Score: Low Fall Risk (0-5 points) (previoulsy  educated and given handout)  Review of Systems  Constitutional: Positive for appetite change.  Respiratory: Positive for shortness of breath and wheezing.   Neurological: Positive for dizziness and numbness.       Tingling  Psychiatric/Behavioral: Positive for dysphoric mood.  All other systems reviewed and are negative.      Objective:   Physical Exam  Tenderness over bilateral suboccipital areas, bilateral levator scapula, right upper trapezius       Assessment & Plan:  1. Myofascial pain. Peri Scapular will repeat trigger point injections.  Trigger point injection Area of bilateral suboccipital, bilateral upper medial scapular border as well as Right upper trap were marked and prepped with Betadine entered with a 25-gauge 1.5 inch needle 1 cc of 1% lidocaine was injected into each point with the exception of 0.5 mL in the suboccipital areas. Patient tolerated procedure well. Post procedure instructions given  2. Fibromyalgias syndrome discussed exercise, and discussed medications. May be able to increase Flexeril to 20 mg at night once the Ambien is discontinued  Continue Ultracet on an as-needed basis as well as the ibuprofen

## 2014-04-05 NOTE — Patient Instructions (Signed)
May increase flexeril to 2 tablets at night if needed after Remus Loffler is stopped

## 2014-07-05 ENCOUNTER — Ambulatory Visit (HOSPITAL_BASED_OUTPATIENT_CLINIC_OR_DEPARTMENT_OTHER): Payer: Medicaid Other | Admitting: Physical Medicine & Rehabilitation

## 2014-07-05 ENCOUNTER — Encounter: Payer: Medicaid Other | Attending: Physical Medicine & Rehabilitation

## 2014-07-05 ENCOUNTER — Encounter: Payer: Self-pay | Admitting: Physical Medicine & Rehabilitation

## 2014-07-05 VITALS — BP 122/74 | HR 78 | Resp 14 | Ht 66.0 in | Wt 143.0 lb

## 2014-07-05 DIAGNOSIS — M791 Myalgia: Secondary | ICD-10-CM

## 2014-07-05 DIAGNOSIS — M7918 Myalgia, other site: Secondary | ICD-10-CM

## 2014-07-05 NOTE — Progress Notes (Signed)
.   Myofascial pain. Peri Scapular will repeat trigger point injections.  Trigger point injection Area of bilateral suboccipital, bilateral upper medial scapular border as well as Right upper trap were marked and prepped with Betadine entered with a 25-gauge 1.5 inch needle 1 cc of 1% lidocaine was injected into each point with the exception of 0.5 mL in the suboccipital areas. Patient tolerated procedure well. Post procedure instructions given

## 2014-07-05 NOTE — Progress Notes (Deleted)
   Subjective:    Patient ID: Peggy Davis, female    DOB: 1960/08/10, 53 y.o.   MRN: [id number]  HPI    Review of Systems  HENT: Negative.   Eyes: Negative.   Respiratory: Negative.   Cardiovascular: Negative.   Gastrointestinal: Negative.   Endocrine: Negative.   Genitourinary: Negative.   Musculoskeletal: Positive for neck pain and neck stiffness.  Skin: Negative.   Allergic/Immunologic: Negative.   Neurological: Positive for weakness and numbness.       Tingling  Psychiatric/Behavioral: Positive for dysphoric mood. The patient is nervous/anxious.        Objective:   Physical Exam        Assessment & Plan:

## 2014-07-05 NOTE — Patient Instructions (Signed)
Trigger Point Injection Trigger points are areas where you have muscle pain. A trigger point injection is a shot given in the trigger point to relieve that pain. A trigger point might feel like a knot in your muscle. It hurts to press on a trigger point. Sometimes the pain spreads out (radiates) to other parts of the body. For example, pressing on a trigger point in your shoulder might cause pain in your arm or neck. You might have one trigger point. Or, you might have more than one. People often have trigger points in their upper back and lower back. They also occur often in the neck and shoulders. Pain from a trigger point lasts for a long time. It can make it hard to keep moving. You might not be able to do the exercise or physical therapy that could help you deal with the pain. A trigger point injection may help. It does not work for everyone. But, it may relieve your pain for a few days or a few months. A trigger point injection does not cure long-lasting (chronic) pain. LET YOUR CAREGIVER KNOW ABOUT:  Any allergies (especially to latex, lidocaine, or steroids).  Blood-thinning medicines that you take. These drugs can lead to bleeding or bruising after an injection. They include:  Aspirin.  Ibuprofen.  Clopidogrel.  Warfarin.  Other medicines you take. This includes all vitamins, herbs, eyedrops, over-the-counter medicines, and creams.  Use of steroids.  Recent infections.  Past problems with numbing medicines.  Bleeding problems.  Surgeries you have had.  Other health problems. RISKS AND COMPLICATIONS A trigger point injection is a safe treatment. However, problems may develop, such as:  Minor side effects usually go away in 1 to 2 days. These may include:  Soreness.  Bruising.  Stiffness.  More serious problems are rare. But, they may include:  Bleeding under the skin (hematoma).  Skin infection.  Breaking off of the needle under your skin.  Lung  puncture.  The trigger point injection may not work for you. BEFORE THE PROCEDURE You may need to stop taking any medicine that thins your blood. This is to prevent bleeding and bruising. Usually these medicines are stopped several days before the injection. No other preparation is needed. PROCEDURE  A trigger point injection can be given in your caregiver's office or in a clinic. Each injection takes 2 minutes or less.  Your caregiver will feel for trigger points. The caregiver may use a marker to circle the area for the injection.  The skin over the trigger point will be washed with a germ-killing (antiseptic) solution.  The caregiver pinches the spot for the injection.  Then, a very thin needle is used for the shot. You may feel pain or a twitching feeling when the needle enters the trigger point.  A numbing solution may be injected into the trigger point. Sometimes a drug to keep down swelling, redness, and warmth (inflammation) is also injected.  Your caregiver moves the needle around the trigger zone until the tightness and twitching goes away.  After the injection, your caregiver may put gentle pressure over the injection site.  Then it is covered with a bandage. AFTER THE PROCEDURE  You can go right home after the injection.  The bandage can be taken off after a few hours.  You may feel sore and stiff for 1 to 2 days.  Go back to your regular activities slowly. Your caregiver may ask you to stretch your muscles. Do not do anything that takes   extra energy for a few days.  Follow your caregiver's instructions to manage and treat other pain. Document Released: 07/04/2011 Document Revised: 11/09/2012 Document Reviewed: 07/04/2011 Exit[employer] Patient Information 2015 [employer]. This information is not intended to replace advice given to you by your health care provider. Make sure you discuss any questions you have with your health care provider.  

## 2014-07-25 ENCOUNTER — Other Ambulatory Visit: Payer: Self-pay | Admitting: Physical Medicine & Rehabilitation

## 2014-09-05 ENCOUNTER — Encounter: Payer: Medicaid Other | Attending: Physical Medicine & Rehabilitation

## 2014-09-05 ENCOUNTER — Encounter: Payer: Self-pay | Admitting: Physical Medicine & Rehabilitation

## 2014-09-05 ENCOUNTER — Ambulatory Visit (HOSPITAL_BASED_OUTPATIENT_CLINIC_OR_DEPARTMENT_OTHER): Payer: Medicaid Other | Admitting: Physical Medicine & Rehabilitation

## 2014-09-05 VITALS — BP 130/73 | HR 97 | Resp 14

## 2014-09-05 DIAGNOSIS — G43709 Chronic migraine without aura, not intractable, without status migrainosus: Secondary | ICD-10-CM | POA: Diagnosis not present

## 2014-09-05 DIAGNOSIS — M797 Fibromyalgia: Secondary | ICD-10-CM

## 2014-09-05 DIAGNOSIS — M7918 Myalgia, other site: Secondary | ICD-10-CM

## 2014-09-05 DIAGNOSIS — IMO0002 Reserved for concepts with insufficient information to code with codable children: Secondary | ICD-10-CM | POA: Insufficient documentation

## 2014-09-05 DIAGNOSIS — M791 Myalgia: Secondary | ICD-10-CM | POA: Diagnosis not present

## 2014-09-05 NOTE — Progress Notes (Signed)
Subjective:    Patient ID: Peggy Davis, female    DOB: Dec 03, 1960, 54 y.o.   MRN: [id number]  HPI Chronic headaches as wel as neck pain and upper back discomfort, has tried PT, meds  Pain Inventory Average Pain 8 Pain Right Now 7 My pain is constant  In the last 24 hours, has pain interfered with the following? General activity 10 Relation with others 10 Enjoyment of life 10 What TIME of day is your pain at its worst? all Sleep (in general) Poor  Pain is worse with: some activites Pain improves with: rest, medication and injections Relief from Meds: 8  Mobility walk without assistance  Function disabled: date disabled .  Neuro/Psych numbness tingling dizziness depression  Prior Studies Any changes since last visit?  no  Physicians involved in your care Any changes since last visit?  no   Family History  Problem Relation Age of Onset  . Diabetes Mother   . Cancer Father    History   Social History  . Marital Status: Legally Separated    Spouse Name: N/A    Number of Children: N/A  . Years of Education: N/A   Social History Main Topics  . Smoking status: Never Smoker   . Smokeless tobacco: Never Used  . Alcohol Use: None  . Drug Use: None  . Sexual Activity: None   Other Topics Concern  . None   Social History Narrative   Past Surgical History  Procedure Laterality Date  . Abdominal hysterectomy    . Brain surgery    . Nasal sinus surgery    . Cholecystectomy    . Appendectomy    . Exploratory on hand     Past Medical History  Diagnosis Date  . Allergy   . Anxiety   . Asthma   . Depression   . Hypertension   . Hyperlipidemia   . Neuromuscular disorder   . Chronic kidney disease   . Arthritis    BP 130/73 mmHg  Pulse 97  Resp 14  SpO2 96%  LMP 04/14/2006  Opioid Risk Score:   Fall Risk Score: Moderate Fall Risk (6-13 points) (patient previously educated) Review of Systems  Neurological: Positive for dizziness and  numbness.       Tingling   Psychiatric/Behavioral: Positive for dysphoric mood.  All other systems reviewed and are negative.      Objective:   Physical Exam  Gen. No acute distress is wearing sunglasses for migraine Tenderness palpation bilateral suboccipital bilateral longissimus right semispinalis capitis Bilateral levator right trapezius      Assessment & Plan:    Myofascial pain. Cervical and periscapular will repeat trigger point injections.  Trigger point injection  Area of bilateral suboccipital,Bilateral longissimus, bilateral upper medial scapular border as well as Right upper trap were marked and prepped with Betadine entered with a 25-gauge 1.5 inch needle 1 cc of 1% lidocaine was injected into each point with the exception of 0.5 mL in the suboccipital areas. Patient tolerated procedure well. Post procedure instructions given   2. Chronic headaches she does have migraine-type headaches with photophobia at least 15 days out of the month. We discussed other treatment options including Botox injection. She would like to look into this further. Literature has been given to her. She will decide whether she wants schedule. 3. Patient has some of her trigger points in a distribution consistent with cervical dystonia. No clearcut signs of torticollis may have milder form. We discussed role of Botox  in this situation as well.

## 2014-09-05 NOTE — Patient Instructions (Signed)
Please cal if you decide to try Botox

## 2014-11-04 ENCOUNTER — Ambulatory Visit: Payer: Medicaid Other | Admitting: Physical Medicine & Rehabilitation

## 2014-11-04 ENCOUNTER — Encounter: Payer: Medicaid Other | Attending: Physical Medicine & Rehabilitation

## 2014-11-04 DIAGNOSIS — M791 Myalgia: Secondary | ICD-10-CM | POA: Insufficient documentation

## 2014-11-15 ENCOUNTER — Ambulatory Visit (HOSPITAL_BASED_OUTPATIENT_CLINIC_OR_DEPARTMENT_OTHER): Payer: Medicaid Other | Admitting: Physical Medicine & Rehabilitation

## 2014-11-15 ENCOUNTER — Encounter: Payer: Self-pay | Admitting: Physical Medicine & Rehabilitation

## 2014-11-15 VITALS — BP 128/81 | HR 80 | Resp 14

## 2014-11-15 DIAGNOSIS — M791 Myalgia: Secondary | ICD-10-CM

## 2014-11-15 DIAGNOSIS — M7918 Myalgia, other site: Secondary | ICD-10-CM

## 2014-11-15 NOTE — Progress Notes (Signed)
Subjective:    Patient ID: Peggy Davis, female    DOB: [birth year], 54 y.o.   MRN: [id number]  HPI   Pain Inventory Average Pain 6 Pain Right Now 6 My pain is constant  In the last 24 hours, has pain interfered with the following? General activity 7 Relation with others 7 Enjoyment of life 7 What TIME of day is your pain at its worst? all Sleep (in general) Poor  Pain is worse with: unsure Pain improves with: rest, medication and injections Relief from Meds: 5  Mobility walk without assistance  Function disabled: date disabled .  Neuro/Psych numbness tingling depression  Prior Studies Any changes since last visit?  no  Physicians involved in your care Any changes since last visit?  no   Family History  Problem Relation Age of Onset  . Diabetes Mother   . Cancer Father    History   Social History  . Marital Status: Legally Separated    Spouse Name: N/A  . Number of Children: N/A  . Years of Education: N/A   Social History Main Topics  . Smoking status: Never Smoker   . Smokeless tobacco: Never Used  . Alcohol Use: Not on file  . Drug Use: Not on file  . Sexual Activity: Not on file   Other Topics Concern  . None   Social History Narrative   Past Surgical History  Procedure Laterality Date  . Abdominal hysterectomy    . Brain surgery    . Nasal sinus surgery    . Cholecystectomy    . Appendectomy    . Exploratory on hand     Past Medical History  Diagnosis Date  . Allergy   . Anxiety   . Asthma   . Depression   . Hypertension   . Hyperlipidemia   . Neuromuscular disorder   . Chronic kidney disease   . Arthritis    BP 128/81 mmHg  Pulse 80  Resp 14  SpO2 97%  LMP 04/14/2006  Opioid Risk Score:   Fall Risk Score:  `1  Depression screen PHQ 2/9  Depression screen PHQ 2/9 11/15/2014  Decreased Interest 0  Down, Depressed, Hopeless 0  PHQ - 2 Score 0  Altered sleeping 3  Tired, decreased energy 2  Change in appetite  0  Feeling bad or failure about yourself  0  Trouble concentrating 3  Moving slowly or fidgety/restless 0  Suicidal thoughts 0  PHQ-9 Score 8    Review of Systems  Musculoskeletal: Negative for arthralgias.  Neurological: Positive for numbness.       Tingling  Psychiatric/Behavioral: Positive for dysphoric mood.  All other systems reviewed and are negative.      Objective:   Physical Exam        Assessment & Plan:  Trigger Point Injection  Indication: Right levator, trapezius, semi-spinalis capitis, longissimus capitis Myofascial pain not relieved by medication management and other conservative care.  Informed consent was obtained after describing risk and benefits of the procedure with the patient, this includes bleeding, bruising, infection and medication side effects.  The patient wishes to proceed and has given written consent.  The patient was placed in a Seated position.  The Right paracervical, posterior and suboccipital area was marked and prepped with Betadine.  It was entered with a 25-gauge 1-1/2 inch needle and 1 mL of 1% lidocaine was injected into each of 4 trigger points, after negative draw back for blood.  The patient tolerated the procedure  well.  Post procedure instructions were given.

## 2014-11-15 NOTE — Patient Instructions (Signed)
Trigger Point Injection Trigger points are areas where you have muscle pain. A trigger point injection is a shot given in the trigger point to relieve that pain. A trigger point might feel like a knot in your muscle. It hurts to press on a trigger point. Sometimes the pain spreads out (radiates) to other parts of the body. For example, pressing on a trigger point in your shoulder might cause pain in your arm or neck. You might have one trigger point. Or, you might have more than one. People often have trigger points in their upper back and lower back. They also occur often in the neck and shoulders. Pain from a trigger point lasts for a long time. It can make it hard to keep moving. You might not be able to do the exercise or physical therapy that could help you deal with the pain. A trigger point injection may help. It does not work for everyone. But, it may relieve your pain for a few days or a few months. A trigger point injection does not cure long-lasting (chronic) pain. LET YOUR CAREGIVER KNOW ABOUT:  Any allergies (especially to latex, lidocaine, or steroids).  Blood-thinning medicines that you take. These drugs can lead to bleeding or bruising after an injection. They include:  Aspirin.  Ibuprofen.  Clopidogrel.  Warfarin.  Other medicines you take. This includes all vitamins, herbs, eyedrops, over-the-counter medicines, and creams.  Use of steroids.  Recent infections.  Past problems with numbing medicines.  Bleeding problems.  Surgeries you have had.  Other health problems. RISKS AND COMPLICATIONS A trigger point injection is a safe treatment. However, problems may develop, such as:  Minor side effects usually go away in 1 to 2 days. These may include:  Soreness.  Bruising.  Stiffness.  More serious problems are rare. But, they may include:  Bleeding under the skin (hematoma).  Skin infection.  Breaking off of the needle under your skin.  Lung  puncture.  The trigger point injection may not work for you. BEFORE THE PROCEDURE You may need to stop taking any medicine that thins your blood. This is to prevent bleeding and bruising. Usually these medicines are stopped several days before the injection. No other preparation is needed. PROCEDURE  A trigger point injection can be given in your caregiver's office or in a clinic. Each injection takes 2 minutes or less.  Your caregiver will feel for trigger points. The caregiver may use a marker to circle the area for the injection.  The skin over the trigger point will be washed with a germ-killing (antiseptic) solution.  The caregiver pinches the spot for the injection.  Then, a very thin needle is used for the shot. You may feel pain or a twitching feeling when the needle enters the trigger point.  A numbing solution may be injected into the trigger point. Sometimes a drug to keep down swelling, redness, and warmth (inflammation) is also injected.  Your caregiver moves the needle around the trigger zone until the tightness and twitching goes away.  After the injection, your caregiver may put gentle pressure over the injection site.  Then it is covered with a bandage. AFTER THE PROCEDURE  You can go right home after the injection.  The bandage can be taken off after a few hours.  You may feel sore and stiff for 1 to 2 days.  Go back to your regular activities slowly. Your caregiver may ask you to stretch your muscles. Do not do anything that takes   extra energy for a few days.  Follow your caregiver's instructions to manage and treat other pain. Document Released: 07/04/2011 Document Revised: 11/09/2012 Document Reviewed: 07/04/2011 Exit[employer] Patient Information 2015 [employer]. This information is not intended to replace advice given to you by your health care provider. Make sure you discuss any questions you have with your health care provider.  

## 2014-11-29 ENCOUNTER — Other Ambulatory Visit: Payer: Self-pay | Admitting: Physical Medicine & Rehabilitation

## 2015-01-16 ENCOUNTER — Encounter: Payer: Medicaid Other | Attending: Physical Medicine & Rehabilitation

## 2015-01-16 ENCOUNTER — Encounter: Payer: Self-pay | Admitting: Physical Medicine & Rehabilitation

## 2015-01-16 ENCOUNTER — Ambulatory Visit (HOSPITAL_BASED_OUTPATIENT_CLINIC_OR_DEPARTMENT_OTHER): Payer: Medicaid Other | Admitting: Physical Medicine & Rehabilitation

## 2015-01-16 VITALS — HR 94 | Resp 14

## 2015-01-16 DIAGNOSIS — M791 Myalgia: Secondary | ICD-10-CM | POA: Insufficient documentation

## 2015-01-16 DIAGNOSIS — M7918 Myalgia, other site: Secondary | ICD-10-CM

## 2015-01-16 DIAGNOSIS — M797 Fibromyalgia: Secondary | ICD-10-CM | POA: Diagnosis not present

## 2015-01-16 DIAGNOSIS — G43709 Chronic migraine without aura, not intractable, without status migrainosus: Secondary | ICD-10-CM | POA: Diagnosis not present

## 2015-01-16 DIAGNOSIS — IMO0002 Reserved for concepts with insufficient information to code with codable children: Secondary | ICD-10-CM

## 2015-01-16 NOTE — Progress Notes (Signed)
Subjective:    Patient ID: Peggy Davis, female    DOB: [birth year], 54 y.o.   MRN: [id number]  HPI Patient feeling more pain in the neck as well as greater frequency of headaches. No visual changes with the headaches. No numbness or tingling in arms or legs no weakness in the arms or legs, no loss of balance no history of trauma to the head.    Pain Inventory Average Pain 6 Pain Right Now 7 My pain is constant, sharp, burning, stabbing, tingling and aching  In the last 24 hours, has pain interfered with the following? General activity 7 Relation with others 7 Enjoyment of life 9 What TIME of day is your pain at its worst? ALL Sleep (in general) Fair  Pain is worse with: some activites Pain improves with: medication and injections Relief from Meds: 6  Mobility walk without assistance how many minutes can you walk? 15-20 ability to climb steps?  yes do you drive?  yes  Function disabled: date disabled .  Neuro/Psych numbness tingling dizziness  Prior Studies Any changes since last visit?  no  Physicians involved in your care Any changes since last visit?  no   Family History  Problem Relation Age of Onset  . Diabetes Mother   . Cancer Father    History   Social History  . Marital Status: Legally Separated    Spouse Name: N/A  . Number of Children: N/A  . Years of Education: N/A   Social History Main Topics  . Smoking status: Never Smoker   . Smokeless tobacco: Never Used  . Alcohol Use: Not on file  . Drug Use: Not on file  . Sexual Activity: Not on file   Other Topics Concern  . None   Social History Narrative   Past Surgical History  Procedure Laterality Date  . Abdominal hysterectomy    . Brain surgery    . Nasal sinus surgery    . Cholecystectomy    . Appendectomy    . Exploratory on hand     Past Medical History  Diagnosis Date  . Allergy   . Anxiety   . Asthma   . Depression   . Hypertension   . Hyperlipidemia   .  Neuromuscular disorder   . Chronic kidney disease   . Arthritis    Pulse 94  Resp 14  SpO2 100%  LMP 04/14/2006  Opioid Risk Score:   Fall Risk Score: Low Fall Risk (0-5 points)`1  Depression screen PHQ 2/9  Depression screen PHQ 2/9 11/15/2014  Decreased Interest 0  Down, Depressed, Hopeless 0  PHQ - 2 Score 0  Altered sleeping 3  Tired, decreased energy 2  Change in appetite 0  Feeling bad or failure about yourself  0  Trouble concentrating 3  Moving slowly or fidgety/restless 0  Suicidal thoughts 0  PHQ-9 Score 8    Review of Systems  Constitutional: Negative.   Eyes: Negative.   Respiratory: Negative.   Cardiovascular: Negative.   Gastrointestinal: Negative.   Endocrine: Negative.   Genitourinary: Negative.   Musculoskeletal: Positive for myalgias, arthralgias and neck pain.  Skin: Negative.   Allergic/Immunologic: Negative.   Neurological: Positive for dizziness, numbness and headaches.       Tingling  Hematological: Negative.   Psychiatric/Behavioral: Negative.        Objective:   Physical Exam  Constitutional: She is oriented to person, place, and time. She appears well-developed and well-nourished.  HENT:  Head: Normocephalic and  atraumatic.  Eyes: Conjunctivae and EOM are normal. Pupils are equal, round, and reactive to light.  Neck: Normal range of motion.  Musculoskeletal: Normal range of motion.  Neurological: She is alert and oriented to person, place, and time.  Psychiatric: She has a normal mood and affect.  Nursing note and vitals reviewed.    Tenderness palpation bilateral, longissimus muscles, bilateral upper trapezius, 2 areas of the right levator scapula      Assessment & Plan:  1.  Fibromylagia, She is complaining of more pain in the head or neck area, this could be fibromyalgia related however she does have other potential pain generators  2.  Chronic Headache, probably migraine, At this point she is having 15 or more headaches  per month, we discussed Botox is potential treatment option for prophylaxis. She will consider this.  3.  Myofascial pain syndrome, She has benefited from trigger point injections in the past, would recommend repeat today Trigger Point Injection  Indication: Cervical Myofascial pain not relieved by medication management and other conservative care.  Informed consent was obtained after describing risk and benefits of the procedure with the patient, this includes bleeding, bruising, infection and medication side effects.  The patient wishes to proceed and has given written consent.  The patient was placed in a Seated position.  The Suboccipital and periscapular area was marked and prepped with Betadine.  It was entered with a 25-gauge 1-1/2 inch needle and 1 mL of 1% lidocaine was injected into each of 6 trigger points, after negative draw back for blood.  The patient tolerated the procedure well.  Post procedure instructions were given.

## 2015-01-16 NOTE — Patient Instructions (Signed)
OnabotulinumtoxinA injection (Medical Use) What is this medicine? ONABOTULINUMTOXINA (o na BOTT you lye num tox in eh) is a neuro-muscular blocker. This medicine is used to treat crossed eyes, eyelid spasms, severe neck muscle spasms, and elbow, wrist, and finger muscle spasms. It is also used to treat excessive underarm sweating, to prevent chronic migraine headaches, and to treat loss of bladder control due to neurologic conditions such as multiple sclerosis or spinal cord injury. This medicine may be used for other purposes; ask your health care provider or pharmacist if you have questions. COMMON BRAND NAME(S): Botox What should I tell my health care provider before I take this medicine? They need to know if you have any of these conditions: -breathing problems -cerebral palsy spasms -difficulty urinating -heart problems -history of surgery where this medicine is going to be used -infection at the site where this medicine is going to be used -myasthenia gravis or other neurologic disease -nerve or muscle disease -surgery plans -take medicines that treat or prevent blood clots -thyroid problems -an unusual or allergic reaction to botulinum toxin, albumin, other medicines, foods, dyes, or preservatives -pregnant or trying to get pregnant -breast-feeding How should I use this medicine? This medicine is for injection into a muscle. It is given by a health care professional in a hospital or clinic setting. Talk to your pediatrician regarding the use of this medicine in children. While this drug may be prescribed for children as young as 12 years old for selected conditions, precautions do apply. Overdosage: If you think you have taken too much of this medicine contact a poison control center or emergency room at once. NOTE: This medicine is only for you. Do not share this medicine with others. What if I miss a dose? This does not apply. What may interact with this  medicine? -aminoglycoside antibiotics like gentamicin, neomycin, tobramycin -muscle relaxants -other botulinum toxin injections This list may not describe all possible interactions. Give your health care provider a list of all the medicines, herbs, non-prescription drugs, or dietary supplements you use. Also tell them if you smoke, drink alcohol, or use illegal drugs. Some items may interact with your medicine. What should I watch for while using this medicine? Visit your doctor for regular check ups. This medicine will cause weakness in the muscle where it is injected. Tell your doctor if you feel unusually weak in other muscles. Get medical help right away if you have problems with breathing, swallowing, or talking. This medicine might make your eyelids droop or make you see blurry or double. If you have weak muscles or trouble seeing do not drive a car, use machinery, or do other dangerous activities. This medicine contains albumin from human blood. It may be possible to pass an infection in this medicine, but no cases have been reported. Talk to your doctor about the risks and benefits of this medicine. If your activities have been limited by your condition, go back to your regular routine slowly after treatment with this medicine. What side effects may I notice from receiving this medicine? Side effects that you should report to your doctor or health care professional as soon as possible: -allergic reactions like skin rash, itching or hives, swelling of the face, lips, or tongue -breathing problems -changes in vision -chest pain or tightness -eye irritation, pain -fast, irregular heartbeat -infection -numbness -speech problems -swallowing problems -unusual weakness Side effects that usually do not require medical attention (report to your doctor or health care professional if they continue or   are bothersome): -bruising or pain at site where injected -drooping eyelid -dry eyes or  mouth -headache -muscles aches, pains -sensitivity to light -tearing This list may not describe all possible side effects. Call your doctor for medical advice about side effects. You may report side effects to FDA at 1-800-FDA-1088. Where should I keep my medicine? This drug is given in a hospital or clinic and will not be stored at home. NOTE: This sheet is a summary. It may not cover all possible information. If you have questions about this medicine, talk to your doctor, pharmacist, or health care provider.  2015, Elsevier/Gold Standard. (2012-04-13 17:30:24)  

## 2015-03-16 ENCOUNTER — Ambulatory Visit: Payer: Medicaid Other | Admitting: Physical Medicine & Rehabilitation

## 2015-03-20 ENCOUNTER — Ambulatory Visit (HOSPITAL_BASED_OUTPATIENT_CLINIC_OR_DEPARTMENT_OTHER): Payer: Medicaid Other | Admitting: Physical Medicine & Rehabilitation

## 2015-03-20 ENCOUNTER — Encounter: Payer: Self-pay | Admitting: Physical Medicine & Rehabilitation

## 2015-03-20 ENCOUNTER — Encounter: Payer: Medicaid Other | Attending: Physical Medicine & Rehabilitation

## 2015-03-20 VITALS — BP 164/94 | HR 92

## 2015-03-20 DIAGNOSIS — M791 Myalgia: Secondary | ICD-10-CM

## 2015-03-20 DIAGNOSIS — M7918 Myalgia, other site: Secondary | ICD-10-CM

## 2015-03-20 MED ORDER — CYCLOBENZAPRINE HCL 10 MG PO TABS: 10.0000 mg | ORAL_TABLET | Freq: Three times a day (TID) | ORAL | Status: DC | PRN

## 2015-03-20 NOTE — Progress Notes (Signed)
Trigger Point Injection  Indication: Bilateral cervical and periscapular Myofascial pain not relieved by medication management and other conservative care.  Informed consent was obtained after describing risk and benefits of the procedure with the patient, this includes bleeding, bruising, infection and medication side effects.  The patient wishes to proceed and has given written consent.  The patient was placed in a Seated position.  The Bilateral levator, Left trap, right rhomboid area was marked and prepped with Betadine.  It was entered with a 25-gauge 1-1/2 inch needle and 1 mL of 1% lidocaine was injected into each of 5 trigger points, after negative draw back for blood.  The patient tolerated the procedure well.  Post procedure instructions were given.

## 2015-03-20 NOTE — Patient Instructions (Signed)
Injected the left trapezius, bilateral levator and right rhomboid areas

## 2015-05-11 ENCOUNTER — Encounter: Payer: Self-pay | Admitting: Physical Medicine & Rehabilitation

## 2015-05-11 ENCOUNTER — Ambulatory Visit (HOSPITAL_BASED_OUTPATIENT_CLINIC_OR_DEPARTMENT_OTHER): Payer: Medicaid Other | Admitting: Physical Medicine & Rehabilitation

## 2015-05-11 ENCOUNTER — Encounter: Payer: Medicaid Other | Attending: Physical Medicine & Rehabilitation

## 2015-05-11 VITALS — BP 118/77 | HR 70

## 2015-05-11 DIAGNOSIS — M7918 Myalgia, other site: Secondary | ICD-10-CM

## 2015-05-11 DIAGNOSIS — M791 Myalgia: Secondary | ICD-10-CM

## 2015-05-11 MED ORDER — TRAMADOL-ACETAMINOPHEN 37.5-325 MG PO TABS: 1.0000 | ORAL_TABLET | Freq: Four times a day (QID) | ORAL | Status: DC | PRN

## 2015-05-11 NOTE — Progress Notes (Signed)
54 year old female with history of Chiari malformation and chronic neck as well as headache pain. She returns today with a Cervical spine [place]. She states that the doctor at [facility] was not able to explain the findings to her. We reviewed this in detail going level by level. I explained that she had normal MRI from C1-C4, mild degenerative changes C4-C5, advanced left C5-C6 foraminal stenosis and moderate right C5-C6 foraminal stenosis but only mild central stenosis at that level. C6-C7 C7-C8 were normal. She had a question of what is a hemangioma. We discussed this as a benign blood vessel abnormality in the vertebral body that is of no clinical consequence.  Last set of trigger point injections did help  Patient has numbness in both hands left greater than right but no significant pain in the arms compared to her neck pain and headache pain.   Gen. No acute distress Mood and affect are appropriate She has tenderness to palpation in the left greater than right upper trapezius as well as the left splenius cervices, right levator scapula and right T7 paraspinal.  Impression 1. Cervical spinal stenosis mainly at C5-C6, foraminal stenosis. Her numbness and tingling could be from the foraminal stenosis. She is not inclined towards surgery. She has no motor loss. At this point she can monitor the situation  2. Myofascial pain syndrome cervical and parascapular as noted above  Trigger point injections  Informed consent was obtained after describing risks and benefits of the procedure the patient does include bleeding bruising and infection she elects to proceed and has given written consent The left splenius cervices right levator scapula and right T7 paraspinal as well as bilateral upper trapezius areas were marked and prepped with Betadine. They were then entered with a 25-gauge 1.5 inch needle and one half mL of lidocaine 1% was injected into each area.  Patient tolerated procedure well No postprocedure complications were noted immediately after procedure Post procedure instructions were given

## 2015-07-11 ENCOUNTER — Encounter: Payer: Self-pay | Admitting: Physical Medicine & Rehabilitation

## 2015-07-11 ENCOUNTER — Encounter: Payer: Medicaid Other | Attending: Physical Medicine & Rehabilitation

## 2015-07-11 ENCOUNTER — Ambulatory Visit (HOSPITAL_BASED_OUTPATIENT_CLINIC_OR_DEPARTMENT_OTHER): Payer: Medicaid Other | Admitting: Physical Medicine & Rehabilitation

## 2015-07-11 VITALS — BP 149/85 | HR 100 | Resp 14

## 2015-07-11 DIAGNOSIS — M791 Myalgia: Secondary | ICD-10-CM | POA: Insufficient documentation

## 2015-07-11 DIAGNOSIS — M7918 Myalgia, other site: Secondary | ICD-10-CM

## 2015-07-11 NOTE — Progress Notes (Signed)
54 year old female with history of Chiari malformation and chronic neck as well as headache pain. She returns today with a Cervical spine MRI [place]. She states that the doctor at [facility] was not able to explain the findings to her. We reviewed this in detail going level by level. I explained that she had normal MRI from C1-C4, mild degenerative changes C4-C5, advanced left C5-C6 foraminal stenosis and moderate right C5-C6 foraminal stenosis but only mild central stenosis at that level. C6-C7 C7-C8 were normal. She had a question of what is a hemangioma. We discussed this as a benign blood vessel abnormality in the vertebral body that is of no clinical consequence.    Trigger point injections x 4  Informed consent was obtained after describing risks and benefits of the procedure the patient does include bleeding bruising and infection she elects to proceed and has given written consent The left  levator scapula and Left suboccipital bilateral upper trapezius areas were marked and prepped with Betadine. They were then entered with a 25-gauge 1.5 inch needle and one half mL of lidocaine 1% was injected into each area. Patient tolerated procedure well No postprocedure complications were noted immediately after procedure Post procedure instructions were given

## 2015-07-11 NOTE — Patient Instructions (Signed)
Trigger Point Injection Trigger points are areas where you have muscle pain. A trigger point injection is a shot given in the trigger point to relieve that pain. A trigger point might feel like a knot in your muscle. It hurts to press on a trigger point. Sometimes the pain spreads out (radiates) to other parts of the body. For example, pressing on a trigger point in your shoulder might cause pain in your arm or neck. You might have one trigger point. Or, you might have more than one. People often have trigger points in their upper back and lower back. They also occur often in the neck and shoulders. Pain from a trigger point lasts for a long time. It can make it hard to keep moving. You might not be able to do the exercise or physical therapy that could help you deal with the pain. A trigger point injection may help. It does not work for everyone. But, it may relieve your pain for a few days or a few months. A trigger point injection does not cure long-lasting (chronic) pain. LET YOUR CAREGIVER KNOW ABOUT:  Any allergies (especially to latex, lidocaine, or steroids).  Blood-thinning medicines that you take. These drugs can lead to bleeding or bruising after an injection. They include:  Aspirin.  Ibuprofen.  Clopidogrel.  Warfarin.  Other medicines you take. This includes all vitamins, herbs, eyedrops, over-the-counter medicines, and creams.  Use of steroids.  Recent infections.  Past problems with numbing medicines.  Bleeding problems.  Surgeries you have had.  Other health problems. RISKS AND COMPLICATIONS A trigger point injection is a safe treatment. However, problems may develop, such as:  Minor side effects usually go away in 1 to 2 days. These may include:  Soreness.  Bruising.  Stiffness.  More serious problems are rare. But, they may include:  Bleeding under the skin (hematoma).  Skin infection.  Breaking off of the needle under your skin.  Lung  puncture.  The trigger point injection may not work for you. BEFORE THE PROCEDURE You may need to stop taking any medicine that thins your blood. This is to prevent bleeding and bruising. Usually these medicines are stopped several days before the injection. No other preparation is needed. PROCEDURE  A trigger point injection can be given in your caregiver's office or in a clinic. Each injection takes 2 minutes or less.  Your caregiver will feel for trigger points. The caregiver may use a marker to circle the area for the injection.  The skin over the trigger point will be washed with a germ-killing (antiseptic) solution.  The caregiver pinches the spot for the injection.  Then, a very thin needle is used for the shot. You may feel pain or a twitching feeling when the needle enters the trigger point.  A numbing solution may be injected into the trigger point. Sometimes a drug to keep down swelling, redness, and warmth (inflammation) is also injected.  Your caregiver moves the needle around the trigger zone until the tightness and twitching goes away.  After the injection, your caregiver may put gentle pressure over the injection site.  Then it is covered with a bandage. AFTER THE PROCEDURE  You can go right home after the injection.  The bandage can be taken off after a few hours.  You may feel sore and stiff for 1 to 2 days.  Go back to your regular activities slowly. Your caregiver may ask you to stretch your muscles. Do not do anything that takes   extra energy for a few days.  Follow your caregiver's instructions to manage and treat other pain.   This information is not intended to replace advice given to you by your health care provider. Make sure you discuss any questions you have with your health care provider.   Document Released: 07/04/2011 Document Revised: 11/09/2012 Document Reviewed: 07/04/2011 Elsevier Interactive Patient Education 2016 Elsevier Inc.  

## 2015-09-11 ENCOUNTER — Encounter: Payer: Self-pay | Admitting: Physical Medicine & Rehabilitation

## 2015-09-11 ENCOUNTER — Ambulatory Visit (HOSPITAL_BASED_OUTPATIENT_CLINIC_OR_DEPARTMENT_OTHER): Payer: Medicaid Other | Admitting: Physical Medicine & Rehabilitation

## 2015-09-11 ENCOUNTER — Encounter: Payer: Medicaid Other | Attending: Physical Medicine & Rehabilitation

## 2015-09-11 VITALS — BP 153/92 | HR 102 | Resp 14

## 2015-09-11 DIAGNOSIS — M791 Myalgia: Secondary | ICD-10-CM

## 2015-09-11 DIAGNOSIS — M797 Fibromyalgia: Secondary | ICD-10-CM | POA: Diagnosis not present

## 2015-09-11 DIAGNOSIS — G43709 Chronic migraine without aura, not intractable, without status migrainosus: Secondary | ICD-10-CM | POA: Diagnosis not present

## 2015-09-11 DIAGNOSIS — IMO0002 Reserved for concepts with insufficient information to code with codable children: Secondary | ICD-10-CM

## 2015-09-11 DIAGNOSIS — M7918 Myalgia, other site: Secondary | ICD-10-CM

## 2015-09-11 MED ORDER — TRAMADOL-ACETAMINOPHEN 37.5-325 MG PO TABS: 1.0000 | ORAL_TABLET | Freq: Two times a day (BID) | ORAL | Status: DC | PRN

## 2015-09-11 NOTE — Patient Instructions (Addendum)
Increase tramadol to twice a day  Recommended acupuncture for diffuse body pain  Will do Botox for chronic migraines

## 2015-09-11 NOTE — Progress Notes (Addendum)
Pt has peripheral pain that feels out here.   Fibromyalgia- type pain all over body Migraine pain intense almost daily for the last 2 months- head and trapezius area Pressure in head- feels like my eyes are popping out Also have trigger points- injections have helped in past   Magnesium bath salts helpful  Acupuncture- not tried yet   Used to see psychiatry limited in which meds could be used  Walks variable distances up to on a good day  ROS-occ dizziness,   Neuro:  Eyes without evidence of nystagmus  Tone is normal without evidence of spasticity Cerebellar exam shows no evidence of ataxia on finger nose finger or heel to shin testing No evidence of trunkal ataxia  Motor strength is 5/5 in bilateral deltoid, biceps, triceps, finger flexors and extensors, wrist flexors and extensors, hip flexors, knee flexors and extensors, ankle dorsiflexors, plantar flexors, invertors and evertors, toe flexors and extensors  Sensory exam is normal to pinprick, proprioception and light touch in the upper and lower limbs    Cranial nerves II- Visual fields are intact to confrontation testing, no blurring of vision III- no evidence of ptosis, upward, downward and medial gaze intact IV- no vertical diplopia or head tilt V- no facial numbness or masseter weakness VI- no pupil abduction weakness VII- no facial droop, good lid closure VII- normal auditory acuity IX- no pharygeal weakness, gag nl X- no pharyngeal weakness, no hoarseness XI- no trap or SCM weakness XII- no glossal weakness  No tenderness to palpation in the lumbar spine There is tenderness over the left upper trapezius in 2 locations in the right upper trapezius and one location. There is also a trigger point left T12 paraspinal    1.  Chronic pain syndrome multifactorial-Patient had many questions regarding her pain today and we discussed this at length. We discussed her diagnosis as well as treatment options.  Chronic  migraines, have become more frequent over the last couple months, Daily episodes for the last 2 months.Prior to that around every other day frequency. We discussed that Botox is a good treatment option for her given her multiple drug allergies. She denies allergy to egg whites.  Myofascial pain Trigger point- Usually helpful. It may be that some of the areas that are painful are not from myofascial pain but instead from her fibromyalgia. Will repeat trigger point injections today  Trigger Point Injection  Indication: Bilateral trapezius, left T12 paraspinal Myofascial pain not relieved by medication management and other conservative care.  Informed consent was obtained after describing risk and benefits of the procedure with the patient, this includes bleeding, bruising, infection and medication side effects.  The patient wishes to proceed and has given written consent.  The patient was placed in a seated position.  The Left upper trap, right upper trap, left T12 paraspinal area was marked and prepped with Betadine.  It was entered with a 25-gauge 1-1/2 inch needle and 1 mL of 1% lidocaine was injected into each of 4 trigger points, after negative draw back for blood.  The patient tolerated the procedure well.  Post procedure instructions were given.  Fibromyalgia syndrome- this accounts for her widespread body pain and perhaps the pain that she describes as being peripheral. She does have some burning type discomfort in her extremities as well.  Recommend increase tramadol to 37.5 mg twice a day, trial of acupuncture.  Over half of the 25 min visit was spent counseling and coordinating care.

## 2015-09-25 ENCOUNTER — Encounter: Payer: Self-pay | Admitting: Physical Medicine & Rehabilitation

## 2015-09-25 ENCOUNTER — Ambulatory Visit (HOSPITAL_BASED_OUTPATIENT_CLINIC_OR_DEPARTMENT_OTHER): Payer: Medicaid Other | Admitting: Physical Medicine & Rehabilitation

## 2015-09-25 VITALS — BP 130/78 | HR 91 | Resp 16

## 2015-09-25 DIAGNOSIS — M791 Myalgia: Secondary | ICD-10-CM | POA: Diagnosis not present

## 2015-09-25 DIAGNOSIS — IMO0002 Reserved for concepts with insufficient information to code with codable children: Secondary | ICD-10-CM

## 2015-09-25 DIAGNOSIS — G43709 Chronic migraine without aura, not intractable, without status migrainosus: Secondary | ICD-10-CM

## 2015-09-25 NOTE — Progress Notes (Signed)
Botox injection for chronic migraines  Indication greater than 15 migraine episodes per month with pain unrelieved by medication management and other conservative care. Patient also has multiple drug allergies which limit certain medication usage.  Botox dilution of 5 units per 0.1 mL 30-gauge 0.5 inch needle  Informed consent was obtained after describing risks and benefits of the procedure with the patient these include bleeding bruising infection swallowing problems facial droop, dry mouth. She elects to proceed and has given written consent.  Patient placed in a seated position areas were marked with a pen and prepped with Betadine. The following doses were injected into the muscles listed below  Procerus 5 units in 1 site Corrugator 10 units divided into sites  Temporalis 40 units divided into 8 sites  Cervical paraspinals 20 units divided into 2 sites Trapezius 30 units divided into 6 sites  Patient tolerated procedure well Post procedure instructions given. Follow-up in 6 weeks

## 2015-09-25 NOTE — Patient Instructions (Signed)
You received a botulinum toxin injection for chronic headache prevention. You may have soreness in your injection site, you may use ice for 20-30 minutes every 2 hours to help with soreness.  Maximum effect of the injection will be at around 1 week. Other side effects include possible muscle weakness which will subside as the medication wears off.  Medication may be repeated every 3 months as needed. 

## 2015-10-25 ENCOUNTER — Other Ambulatory Visit: Payer: Self-pay | Admitting: Physical Medicine & Rehabilitation

## 2015-11-03 ENCOUNTER — Ambulatory Visit: Payer: Medicaid Other | Admitting: Physical Medicine & Rehabilitation

## 2015-11-03 ENCOUNTER — Ambulatory Visit: Payer: Medicaid Other

## 2015-11-13 ENCOUNTER — Ambulatory Visit (HOSPITAL_BASED_OUTPATIENT_CLINIC_OR_DEPARTMENT_OTHER): Payer: Medicaid Other | Admitting: Physical Medicine & Rehabilitation

## 2015-11-13 ENCOUNTER — Encounter: Payer: Medicaid Other | Attending: Physical Medicine & Rehabilitation

## 2015-11-13 ENCOUNTER — Encounter: Payer: Self-pay | Admitting: Physical Medicine & Rehabilitation

## 2015-11-13 VITALS — BP 125/7 | HR 86 | Resp 14

## 2015-11-13 DIAGNOSIS — M791 Myalgia: Secondary | ICD-10-CM

## 2015-11-13 DIAGNOSIS — G43709 Chronic migraine without aura, not intractable, without status migrainosus: Secondary | ICD-10-CM | POA: Diagnosis not present

## 2015-11-13 DIAGNOSIS — IMO0002 Reserved for concepts with insufficient information to code with codable children: Secondary | ICD-10-CM

## 2015-11-13 DIAGNOSIS — M7918 Myalgia, other site: Secondary | ICD-10-CM

## 2015-11-13 NOTE — Progress Notes (Signed)
Patient follows up after Botox injection performed 6 weeks ago. This is for chronic migraines. She had migraines almost every day. Because of concerns regarding multiple medication sensitivities only 100 units were injected rather than the typical 155 units. Patient had no post procedure complications Patient feels like it was helpful starting about 1.5 weeks after the injection. She had 5 headache free days in a row followed by a reduced headache frequency for another month and now it feels like her headaches are returning at their usual frequency.  Review of systems negative for bleeding bruising, no eyelid droop.  Face has no evidence of ptosis, no evidence of  Asymmetry with forehead wrinkling.  Impression 1. History of chronic migraines with some relief after Botox injection 100 units. Patient's duration of response shorter than what would be expected however this would likely improve with injecting standard dose and using standard treatment protocol. We'll schedule for 6 weeks. Discussed with patient agrees with plan

## 2015-11-13 NOTE — Progress Notes (Signed)
Trigger Point Injection  Indication: Cervical and periscapular Myofascial pain not relieved by medication management and other conservative care.  Informed consent was obtained after describing risk and benefits of the procedure with the patient, this includes bleeding, bruising, infection and medication side effects.  The patient wishes to proceed and has given written consent.  The patient was placed in a seated position.  The Bilateral trapezius, Left smispinalis cap, L levator scap area was marked and prepped with Betadine.  It was entered with a 25-gauge 1-1/2 inch needle and 1 mL of 1% lidocaine was injected into each of 5 trigger points, after negative draw back for blood.  The patient tolerated the procedure well.  Post procedure instructions were given.

## 2015-11-13 NOTE — Patient Instructions (Signed)
We will use full dose Botox next visit

## 2015-12-22 ENCOUNTER — Encounter: Payer: Self-pay | Admitting: Physical Medicine & Rehabilitation

## 2015-12-22 ENCOUNTER — Encounter: Payer: Medicaid Other | Attending: Physical Medicine & Rehabilitation

## 2015-12-22 ENCOUNTER — Ambulatory Visit (HOSPITAL_BASED_OUTPATIENT_CLINIC_OR_DEPARTMENT_OTHER): Payer: Medicaid Other | Admitting: Physical Medicine & Rehabilitation

## 2015-12-22 VITALS — BP 129/70 | HR 89 | Resp 14

## 2015-12-22 DIAGNOSIS — G43709 Chronic migraine without aura, not intractable, without status migrainosus: Secondary | ICD-10-CM

## 2015-12-22 DIAGNOSIS — IMO0002 Reserved for concepts with insufficient information to code with codable children: Secondary | ICD-10-CM

## 2015-12-22 DIAGNOSIS — M791 Myalgia: Secondary | ICD-10-CM | POA: Diagnosis not present

## 2015-12-22 NOTE — Progress Notes (Signed)
Botox injection for chronic migraines  Indication greater than 15 migraine episodes per month with pain unrelieved by medication management and other conservative care. Patient also has multiple drug allergies which limit certain medication usage.  Botox dilution of 5 units per 0.1 mL 30-gauge 0.5 inch needle  Informed consent was obtained after describing risks and benefits of the procedure with the patient these include bleeding bruising infection swallowing problems facial droop, dry mouth. She elects to proceed and has given written consent.  Patient placed in a seated position areas were marked with a pen and prepped with Betadine. The following doses were injected into the muscles listed below  Procerus 5 units in 1 site Corrugator 10 units divided into 2 sites Frontalis 20 units into 4 sites  Temporalis 40 units divided into 8 sites  Cervical paraspinals 20 units divided into 2 sites Trapezius 30 units divided into 6 sites Occipitalis total 30 units divided into 6 sites  Patient tolerated procedure well Post procedure instructions given. Follow-up in 6 weeks

## 2016-01-12 ENCOUNTER — Encounter: Payer: Self-pay | Admitting: Physical Medicine & Rehabilitation

## 2016-01-12 ENCOUNTER — Encounter: Payer: Medicaid Other | Attending: Physical Medicine & Rehabilitation

## 2016-01-12 ENCOUNTER — Ambulatory Visit (HOSPITAL_BASED_OUTPATIENT_CLINIC_OR_DEPARTMENT_OTHER): Payer: Medicaid Other | Admitting: Physical Medicine & Rehabilitation

## 2016-01-12 VITALS — BP 136/90 | HR 88 | Resp 14

## 2016-01-12 DIAGNOSIS — M791 Myalgia: Secondary | ICD-10-CM | POA: Diagnosis not present

## 2016-01-12 DIAGNOSIS — G43709 Chronic migraine without aura, not intractable, without status migrainosus: Secondary | ICD-10-CM

## 2016-01-12 DIAGNOSIS — M7918 Myalgia, other site: Secondary | ICD-10-CM

## 2016-01-12 DIAGNOSIS — IMO0002 Reserved for concepts with insufficient information to code with codable children: Secondary | ICD-10-CM

## 2016-01-12 NOTE — Patient Instructions (Signed)
We will check whether Dysport is covered by Medicaid for the indication of chronic migraine

## 2016-01-12 NOTE — Progress Notes (Signed)
Subjective:    Patient ID: Peggy Davis, female    DOB: 06-05-1961, 55 y.o.   MRN: [id number]  HPI 55 year old female with history of chronic migraine as well as fibromyalgia as well as myofascial pain syndrome. She underwent Botox injection for chronic migraines approximately 3 weeks ago.  Prior to Botox injection her headache frequency was 5 days per week. Post Botox injection headache frequency is 3 days a week and also less intense pain. She has not used her tramadol for at least 6 weeks.  He continues to use tumor as well as magnesium as well as taking bath in [place] salts   Pain Inventory Average Pain 7 Pain Right Now 6 My pain is constant and sharp  In the last 24 hours, has pain interfered with the following? General activity 7 Relation with others 8 Enjoyment of life 7 What TIME of day is your pain at its worst? varies Sleep (in general) Fair  Pain is worse with: no selection Pain improves with: rest, medication and injections Relief from Meds: 6  Mobility walk without assistance ability to climb steps?  yes do you drive?  yes  Function Do you have any goals in this area?  no  Neuro/Psych weakness numbness tingling dizziness  Prior Studies Any changes since last visit?  no  Physicians involved in your care Any changes since last visit?  no   Family History  Problem Relation Age of Onset  . Diabetes Mother   . Cancer Father    Social History   Social History  . Marital Status: Legally Separated    Spouse Name: N/A  . Number of Children: N/A  . Years of Education: N/A   Social History Main Topics  . Smoking status: Never Smoker   . Smokeless tobacco: Never Used  . Alcohol Use: None  . Drug Use: None  . Sexual Activity: Not Asked   Other Topics Concern  . None   Social History Narrative   Past Surgical History  Procedure Laterality Date  . Abdominal hysterectomy    . Brain surgery    . Nasal sinus surgery    .  Cholecystectomy    . Appendectomy    . Exploratory on hand     Past Medical History  Diagnosis Date  . Allergy   . Anxiety   . Asthma   . Depression   . Hypertension   . Hyperlipidemia   . Neuromuscular disorder (HCC)   . Chronic kidney disease   . Arthritis    BP 136/90 mmHg  Pulse 88  Resp 14  SpO2 98%  LMP 04/14/2006  Opioid Risk Score:   Fall Risk Score:  `1  Depression screen PHQ 2/9  Depression screen [facility] 2/9 09/11/2015 11/15/2014  Decreased Interest 2 0  Down, Depressed, Hopeless 0 0  PHQ - 2 Score 2 0  Altered sleeping 3 3  Tired, decreased energy 3 2  Change in appetite 1 0  Feeling bad or failure about yourself  0 0  Trouble concentrating 3 3  Moving slowly or fidgety/restless 3 0  Suicidal thoughts 0 0  PHQ-9 Score 15 8  Difficult doing work/chores Very difficult -     Review of Systems  Respiratory: Positive for shortness of breath and wheezing.        Asthma   All other systems reviewed and are negative.      Objective:   Physical Exam  Constitutional: She is oriented to person, place, and time.  She appears well-developed and well-nourished.  HENT:  Head: Normocephalic and atraumatic.  Eyes: Conjunctivae and EOM are normal. Pupils are equal, round, and reactive to light.  Neck: Normal range of motion.  Neurological: She is alert and oriented to person, place, and time.  Psychiatric: She has a normal mood and affect.  Nursing note and vitals reviewed.   Patient without evidence of facial droop. No evidence of ptosis and She has tenderness to palpation bilateral trapezius as well as bilateral upper medial scapular border as well as right infraspinatus area.      Assessment & Plan:  1. Chronic migraines with a 40% reduction in headache frequency as well as a reduction in headache intensity. This was following Botox injection. She had several days about 1 or 2 weeks after the injection that she had no headaches.  She feels like it is  "wearing off" however her migraine frequency is not back at her baseline. She is had no adverse effects, no facial droop  2. Myofascial pain syndrome with trigger points bilateral trapezius, bilateral upper medial scapular border/levator area, right infraspinatus  Trigger Point Injection  Indication: Cervical parascapular  Myofascial pain not relieved by medication management and other conservative care.  Informed consent was obtained after describing risk and benefits of the procedure with the patient, this includes bleeding, bruising, infection and medication side effects.  The patient wishes to proceed and has given written consent.  The patient was placed in a Seated position.  The Bilateral Upper trapezius, Bilateral levator scapula, Right infraspinatusarea was marked and prepped with Betadine.  It was entered with a 25-gauge 1-1/2 inch needle and 1 mL of 1% lidocaine was injected into each of 5 trigger points, after negative draw back for blood.  The patient tolerated the procedure well.  Post procedure instructions were given.

## 2016-02-19 ENCOUNTER — Telehealth: Payer: Self-pay | Admitting: Physical Medicine & Rehabilitation

## 2016-02-19 NOTE — Telephone Encounter (Signed)
Please advise 

## 2016-02-19 NOTE — Telephone Encounter (Signed)
That is not a side effect of either the Botox injection or the trigger point injection. She may need to check with her dentist

## 2016-02-19 NOTE — Telephone Encounter (Signed)
Patient would like to know if any injections she receives from our office would make her jaw not open more than a little bit.  Please call her at [phone].

## 2016-02-20 NOTE — Telephone Encounter (Signed)
Left message to advise pt  

## 2016-03-14 ENCOUNTER — Ambulatory Visit (HOSPITAL_BASED_OUTPATIENT_CLINIC_OR_DEPARTMENT_OTHER): Payer: Medicaid Other | Admitting: Physical Medicine & Rehabilitation

## 2016-03-14 ENCOUNTER — Encounter: Payer: Medicaid Other | Attending: Physical Medicine & Rehabilitation

## 2016-03-14 ENCOUNTER — Encounter: Payer: Self-pay | Admitting: Physical Medicine & Rehabilitation

## 2016-03-14 DIAGNOSIS — M791 Myalgia: Secondary | ICD-10-CM | POA: Diagnosis present

## 2016-03-14 DIAGNOSIS — M7918 Myalgia, other site: Secondary | ICD-10-CM

## 2016-03-14 NOTE — Patient Instructions (Signed)
Trigger Point Injection Trigger points are areas where you have muscle pain. A trigger point injection is a shot given in the trigger point to relieve that pain. A trigger point might feel like a knot in your muscle. It hurts to press on a trigger point. Sometimes the pain spreads out (radiates) to other parts of the body. For example, pressing on a trigger point in your shoulder might cause pain in your arm or neck. You might have one trigger point. Or, you might have more than one. People often have trigger points in their upper back and lower back. They also occur often in the neck and shoulders. Pain from a trigger point lasts for a long time. It can make it hard to keep moving. You might not be able to do the exercise or physical therapy that could help you deal with the pain. A trigger point injection may help. It does not work for everyone. But, it may relieve your pain for a few days or a few months. A trigger point injection does not cure long-lasting (chronic) pain. LET YOUR CAREGIVER KNOW ABOUT:  Any allergies (especially to latex, lidocaine, or steroids).  Blood-thinning medicines that you take. These drugs can lead to bleeding or bruising after an injection. They include:  Aspirin.  Ibuprofen.  Clopidogrel.  Warfarin.  Other medicines you take. This includes all vitamins, herbs, eyedrops, over-the-counter medicines, and creams.  Use of steroids.  Recent infections.  Past problems with numbing medicines.  Bleeding problems.  Surgeries you have had.  Other health problems. RISKS AND COMPLICATIONS A trigger point injection is a safe treatment. However, problems may develop, such as:  Minor side effects usually go away in 1 to 2 days. These may include:  Soreness.  Bruising.  Stiffness.  More serious problems are rare. But, they may include:  Bleeding under the skin (hematoma).  Skin infection.  Breaking off of the needle under your skin.  Lung  puncture.  The trigger point injection may not work for you. BEFORE THE PROCEDURE You may need to stop taking any medicine that thins your blood. This is to prevent bleeding and bruising. Usually these medicines are stopped several days before the injection. No other preparation is needed. PROCEDURE  A trigger point injection can be given in your caregiver's office or in a clinic. Each injection takes 2 minutes or less.  Your caregiver will feel for trigger points. The caregiver may use a marker to circle the area for the injection.  The skin over the trigger point will be washed with a germ-killing (antiseptic) solution.  The caregiver pinches the spot for the injection.  Then, a very thin needle is used for the shot. You may feel pain or a twitching feeling when the needle enters the trigger point.  A numbing solution may be injected into the trigger point. Sometimes a drug to keep down swelling, redness, and warmth (inflammation) is also injected.  Your caregiver moves the needle around the trigger zone until the tightness and twitching goes away.  After the injection, your caregiver may put gentle pressure over the injection site.  Then it is covered with a bandage. AFTER THE PROCEDURE  You can go right home after the injection.  The bandage can be taken off after a few hours.  You may feel sore and stiff for 1 to 2 days.  Go back to your regular activities slowly. Your caregiver may ask you to stretch your muscles. Do not do anything that takes   extra energy for a few days.  Follow your caregiver's instructions to manage and treat other pain.   This information is not intended to replace advice given to you by your health care provider. Make sure you discuss any questions you have with your health care provider.   Document Released: 07/04/2011 Document Revised: 11/09/2012 Document Reviewed: 07/04/2011 Elsevier Interactive Patient Education 2016 Elsevier Inc.  

## 2016-03-14 NOTE — Progress Notes (Signed)
Subjective:    Patient ID: Peggy Davis, female    DOB: 1960-11-23, 55 y.o.   MRN: [id number]  HPI  Pain Inventory Average Pain 7 Pain Right Now 5 My pain is intermittent and constant  In the last 24 hours, has pain interfered with the following? General activity 8 Relation with others 6 Enjoyment of life 7 What TIME of day is your pain at its worst? n/a Sleep (in general) Fair  Pain is worse with: some activites Pain improves with: medication and injections Relief from Meds: 5  Mobility walk without assistance  Function Do you have any goals in this area?  no  Neuro/Psych numbness tingling dizziness  Prior Studies Any changes since last visit?  no  Physicians involved in your care Any changes since last visit?  no   Family History  Problem Relation Age of Onset  . Diabetes Mother   . Cancer Father    Social History   Social History  . Marital status: Legally Separated    Spouse name: N/A  . Number of children: N/A  . Years of education: N/A   Social History Main Topics  . Smoking status: Never Smoker  . Smokeless tobacco: Never Used  . Alcohol use None  . Drug use: Unknown  . Sexual activity: Not Asked   Other Topics Concern  . None   Social History Narrative  . None   Past Surgical History:  Procedure Laterality Date  . ABDOMINAL HYSTERECTOMY    . APPENDECTOMY    . BRAIN SURGERY    . CHOLECYSTECTOMY    . exploratory on hand    . NASAL SINUS SURGERY     Past Medical History:  Diagnosis Date  . Allergy   . Anxiety   . Arthritis   . Asthma   . Chronic kidney disease   . Depression   . Hyperlipidemia   . Hypertension   . Neuromuscular disorder (HCC)    BP 132/85 (BP Location: Left Arm, Patient Position: Sitting, Cuff Size: Normal)   Pulse 86   Temp 97.8 F (36.6 C) (Oral)   LMP 04/14/2006   SpO2 92%   Opioid Risk Score:   Fall Risk Score:  `1  Depression screen PHQ 2/9  [facility] 09/11/2015 11/15/2014   Decreased Interest 2 0  Down, Depressed, Hopeless 0 0  PHQ - 2 Score 2 0  Altered sleeping 3 3  Tired, decreased energy 3 2  Change in appetite 1 0  Feeling bad or failure about yourself  0 0  Trouble concentrating 3 3  Moving slowly or fidgety/restless 3 0  Suicidal thoughts 0 0  PHQ-9 Score 15 8  Difficult doing work/chores Very difficult -   Review of Systems  All other systems reviewed and are negative.      Objective:   Physical Exam        Assessment & Plan:   1. Chronic myofascial pain associated with history of Chiari malformation status post suboccipital craniotomy.  Trigger Point Injection  Indication: Cervical parascapular  Myofascial pain not relieved by medication management and other conservative care.  Informed consent was obtained after describing risk and benefits of the procedure with the patient, this includes bleeding, bruising, infection and medication side effects.  The patient wishes to proceed and has given written consent.  The patient was placed in a Seated position.  The Bilateral Upper trapezius, Bilateral levator scapula, Right infraspinatusarea was marked and prepped with Betadine.  It was  entered with a 25-gauge 1-1/2 inch needle and 1 mL of 1% lidocaine was injected into each of 5 trigger points, after negative draw back for blood.  The patient tolerated the procedure well.  Post procedure instructions were given.

## 2016-03-22 ENCOUNTER — Encounter: Payer: Self-pay | Admitting: Physical Medicine & Rehabilitation

## 2016-03-22 ENCOUNTER — Ambulatory Visit (HOSPITAL_BASED_OUTPATIENT_CLINIC_OR_DEPARTMENT_OTHER): Payer: Medicaid Other | Admitting: Physical Medicine & Rehabilitation

## 2016-03-22 VITALS — BP 131/82 | HR 91 | Resp 14

## 2016-03-22 DIAGNOSIS — M791 Myalgia: Secondary | ICD-10-CM | POA: Diagnosis not present

## 2016-03-22 DIAGNOSIS — G43709 Chronic migraine without aura, not intractable, without status migrainosus: Secondary | ICD-10-CM

## 2016-03-22 DIAGNOSIS — IMO0002 Reserved for concepts with insufficient information to code with codable children: Secondary | ICD-10-CM

## 2016-03-22 NOTE — Progress Notes (Signed)
Botox injection for chronic migraines  Indication greater than 15 migraine episodes per month with pain unrelieved by medication management and other conservative care. Patient also has multiple drug allergies which limit certain medication usage.  Botox dilution of 5 units per 0.1 mL 30-gauge 0.5 inch needle  Informed consent was obtained after describing risks and benefits of the procedure with the patient these include bleeding bruising infection swallowing problems facial droop, dry mouth. She elects to proceed and has given written consent.  Patient placed in a seated position areas were marked with a pen and prepped with Betadine. The following doses were injected into the muscles listed below  Procerus 5 units in 1 site Corrugator 10 units divided into 2 sites Frontalis 20 units into 4 sites  Temporalis 40 units divided into 8 sites  Cervical paraspinals 20 units divided into 2 sites Trapezius 30 units divided into 6 sites Occipitalis total 30 units divided into 6 sites  Patient tolerated procedure well Post procedure instructions given. Follow-up in 6 weeks

## 2016-03-22 NOTE — Patient Instructions (Signed)
You received a botulinum toxin injection for chronic headache prevention. You may have soreness in your injection site, you may use ice for 20-30 minutes every 2 hours to help with soreness.  Maximum effect of the injection will be at around 1 week. Other side effects include possible muscle weakness which will subside as the medication wears off.  Medication may be repeated every 3 months as needed. 

## 2016-05-02 ENCOUNTER — Other Ambulatory Visit: Payer: Self-pay | Admitting: Physical Medicine & Rehabilitation

## 2016-05-14 ENCOUNTER — Encounter: Payer: Self-pay | Admitting: Physical Medicine & Rehabilitation

## 2016-05-14 ENCOUNTER — Encounter: Payer: Medicaid Other | Attending: Physical Medicine & Rehabilitation

## 2016-05-14 ENCOUNTER — Ambulatory Visit (HOSPITAL_BASED_OUTPATIENT_CLINIC_OR_DEPARTMENT_OTHER): Payer: Medicaid Other | Admitting: Physical Medicine & Rehabilitation

## 2016-05-14 VITALS — BP 138/85 | HR 91 | Resp 14

## 2016-05-14 DIAGNOSIS — M791 Myalgia: Secondary | ICD-10-CM

## 2016-05-14 DIAGNOSIS — M7918 Myalgia, other site: Secondary | ICD-10-CM

## 2016-05-14 NOTE — Progress Notes (Signed)
Trigger Point Injection  Indication: Cervical Myofascial pain not relieved by medication management and other conservative care.  Informed consent was obtained after describing risk and benefits of the procedure with the patient, this includes bleeding, bruising, infection and medication side effects.  The patient wishes to proceed and has given written consent.  The patient was placed in a seated position.  The bilateral trapezius, right levator scapula, left longissimus, left suboccipital area was marked and prepped with Betadine.  It was entered with a 25-gauge 1-1/2 inch needle and 1 mL of 1% lidocaine was injected into each of 5 trigger points, after negative draw back for blood.  The patient tolerated the procedure well.  Post procedure instructions were given.

## 2016-06-03 ENCOUNTER — Other Ambulatory Visit: Payer: Self-pay | Admitting: Physical Medicine & Rehabilitation

## 2016-06-24 ENCOUNTER — Encounter: Payer: Medicaid Other | Attending: Physical Medicine & Rehabilitation

## 2016-06-24 ENCOUNTER — Ambulatory Visit (HOSPITAL_BASED_OUTPATIENT_CLINIC_OR_DEPARTMENT_OTHER): Payer: Medicaid Other | Admitting: Physical Medicine & Rehabilitation

## 2016-06-24 ENCOUNTER — Encounter: Payer: Self-pay | Admitting: Physical Medicine & Rehabilitation

## 2016-06-24 VITALS — BP 147/92 | HR 91

## 2016-06-24 DIAGNOSIS — M791 Myalgia: Secondary | ICD-10-CM | POA: Insufficient documentation

## 2016-06-24 DIAGNOSIS — IMO0002 Reserved for concepts with insufficient information to code with codable children: Secondary | ICD-10-CM

## 2016-06-24 DIAGNOSIS — G43709 Chronic migraine without aura, not intractable, without status migrainosus: Secondary | ICD-10-CM

## 2016-06-24 NOTE — Progress Notes (Signed)
Botox injection for chronic migraine prophylaxis.  Indication: History of migraines with greater than 15 headaches per month despite trials of oral medications.  Informed consent was obtained after discussing risks and benefits of the procedure with the patient this included bleeding bruising infection as well as facial drooping, eyelid lag, systemic effects of Botox. Patient elects to proceed and has given written consent.  Patient placed in a seated position Dilution 50 units per cc  Muscles injected with dose  Procerus 5 units Corrugator 5 units bilateral Frontalis 5 units 2 injection sites on the right and 2 injection sites on the left Temporalis 5 units in 4 injection sites on the right and 4 injection sites on the left Occipitalis 5 units into 3 injections sites on the right and 3 injection sites on the left  Cervical paraspinals 5 units into 2 injection sites on the right and 2 injection sites on the left trapezius 5 units into 3 injection sites on the right and 3 injection sites on the left Levator scap 5U x 2 on right and x2 on left Patient tolerated procedure well. Post procedure instructions given. Appointment for repeat injection in 3 months Medical management as per neurolog

## 2016-06-24 NOTE — Patient Instructions (Signed)

## 2016-07-09 ENCOUNTER — Other Ambulatory Visit: Payer: Self-pay | Admitting: Physical Medicine & Rehabilitation

## 2016-07-15 ENCOUNTER — Encounter: Payer: Medicaid Other | Attending: Physical Medicine & Rehabilitation

## 2016-07-15 ENCOUNTER — Ambulatory Visit (HOSPITAL_BASED_OUTPATIENT_CLINIC_OR_DEPARTMENT_OTHER): Payer: Medicaid Other | Admitting: Physical Medicine & Rehabilitation

## 2016-07-15 ENCOUNTER — Encounter: Payer: Self-pay | Admitting: Physical Medicine & Rehabilitation

## 2016-07-15 DIAGNOSIS — M791 Myalgia: Secondary | ICD-10-CM | POA: Diagnosis present

## 2016-07-15 DIAGNOSIS — M7918 Myalgia, other site: Secondary | ICD-10-CM

## 2016-07-15 NOTE — Progress Notes (Signed)
Trigger Point Injection  Indication: Cervical Myofascial pain not relieved by medication management and other conservative care.  Informed consent was obtained after describing risk and benefits of the procedure with the patient, this includes bleeding, bruising, infection and medication side effects.  The patient wishes to proceed and has given written consent.  The patient was placed in a eated position.  The Right trap,Right levator, Right infraspinatus, Right longissimus capitus area was marked and prepped with Betadine.  It was entered with a 25-gauge 1-1/2 inch needle and 1 mL of 1% lidocaine was injected into each of 4 trigger points, after negative draw back for blood.  The patient tolerated the procedure well.  Post procedure instructions were given.

## 2016-08-05 ENCOUNTER — Ambulatory Visit: Payer: Medicaid Other | Admitting: Physical Medicine & Rehabilitation

## 2016-09-02 ENCOUNTER — Ambulatory Visit (HOSPITAL_BASED_OUTPATIENT_CLINIC_OR_DEPARTMENT_OTHER): Payer: Medicaid Other | Admitting: Physical Medicine & Rehabilitation

## 2016-09-02 ENCOUNTER — Encounter: Payer: Self-pay | Admitting: Physical Medicine & Rehabilitation

## 2016-09-02 VITALS — BP 147/64 | HR 92 | Resp 14

## 2016-09-02 DIAGNOSIS — M791 Myalgia: Secondary | ICD-10-CM | POA: Diagnosis not present

## 2016-09-02 DIAGNOSIS — M7918 Myalgia, other site: Secondary | ICD-10-CM

## 2016-09-02 NOTE — Progress Notes (Signed)
Trigger Point Injection  Indication: Cervical Myofascial pain not relieved by medication management and other conservative care.  Informed consent was obtained after describing risk and benefits of the procedure with the patient, this includes bleeding, bruising, infection and medication side effects.  The patient wishes to proceed and has given written consent.  The patient was placed in a seated position.  The R trap,levator,T3 paraspinal, L T3 paraspinal L levator, L trap area was marked and prepped with Betadine.  It was entered with a 25-gauge 1-1/2 inch needle and .5-1 mL of 1% lidocaine was injected into each of 9 trigger points, after negative draw back for blood.  The patient tolerated the procedure well.  Post procedure instructions were given.

## 2016-09-16 ENCOUNTER — Ambulatory Visit: Payer: Medicaid Other | Admitting: Physical Medicine & Rehabilitation

## 2016-09-23 ENCOUNTER — Ambulatory Visit (HOSPITAL_BASED_OUTPATIENT_CLINIC_OR_DEPARTMENT_OTHER): Payer: Medicaid Other | Admitting: Physical Medicine & Rehabilitation

## 2016-09-23 ENCOUNTER — Encounter: Payer: Self-pay | Admitting: Physical Medicine & Rehabilitation

## 2016-09-23 ENCOUNTER — Encounter: Payer: Medicaid Other | Attending: Physical Medicine & Rehabilitation

## 2016-09-23 VITALS — BP 150/91 | HR 89

## 2016-09-23 DIAGNOSIS — G43709 Chronic migraine without aura, not intractable, without status migrainosus: Secondary | ICD-10-CM

## 2016-09-23 DIAGNOSIS — IMO0002 Reserved for concepts with insufficient information to code with codable children: Secondary | ICD-10-CM

## 2016-09-23 DIAGNOSIS — M791 Myalgia: Secondary | ICD-10-CM | POA: Diagnosis not present

## 2016-09-23 NOTE — Patient Instructions (Signed)

## 2016-09-23 NOTE — Progress Notes (Signed)
Botox injection for chronic migraines using Allergan protocol plus additional points listed below  Indication: History of migraines with greater than 15 headaches per month despite trials of oral medications.  Informed consent was obtained after discussing risks and benefits of the procedure with the patient this included bleeding bruising infection as well as facial drooping, eyelid lag, systemic effects of Botox. Patient elects to proceed and has given written consent.  Patient placed in a seated position Dilution 50 units per cc  Muscles injected with dose  Procerus 5 units Corrugator 5 units bilateral Frontalis 5 units 2 injection sites on the right and 2 injection sites on the left Temporalis 5 units in 4 injection sites on the right and 4 injection sites on the left Occipitalis 5 units into 3 injections sites on the right and 3 injection sites on the left  Cervical paraspinals 5 units into 2 injection sites on the right and 2 injection sites on the left trapezius 5 units into 3 injection sites on the right and 3 injection sites on the left  10 units, right levator, 10 units. Left levator Wasted, 25 units Patient tolerated procedure well. Post procedure instructions given. Appointment for repeat injection in 3 months Medical management as per neurology 

## 2016-11-04 ENCOUNTER — Encounter: Payer: Self-pay | Admitting: Physical Medicine & Rehabilitation

## 2016-11-04 ENCOUNTER — Encounter: Payer: Medicaid Other | Attending: Physical Medicine & Rehabilitation

## 2016-11-04 ENCOUNTER — Ambulatory Visit (HOSPITAL_BASED_OUTPATIENT_CLINIC_OR_DEPARTMENT_OTHER): Payer: Medicaid Other | Admitting: Physical Medicine & Rehabilitation

## 2016-11-04 VITALS — BP 139/87 | HR 99 | Resp 14

## 2016-11-04 DIAGNOSIS — M791 Myalgia: Secondary | ICD-10-CM

## 2016-11-04 DIAGNOSIS — M7918 Myalgia, other site: Secondary | ICD-10-CM

## 2016-11-04 NOTE — Progress Notes (Signed)
Trigger Point Injection  Indication: Cervical and periscapular Myofascial pain not relieved by medication management and other conservative care.  Informed consent was obtained after describing risk and benefits of the procedure with the patient, this includes bleeding, bruising, infection and medication side effects.  The patient wishes to proceed and has given written consent.  The patient was placed in a Seated position.  The See below area was marked and prepped with Betadine.  It was entered with a 25-gauge 1-1/2 inch needle and 1 mL of 1% lidocaine was injected into each of 9 trigger points, left infraspinatus, left levator scapula times 2, left suboccipital, right trapezius, right subscapular/infraspinatus, right levator scapula, right suboccipital after negative draw back for blood.  The patient tolerated the procedure well.  Post procedure instructions were given.

## 2016-11-04 NOTE — Patient Instructions (Addendum)
Trigger Point Injection Trigger points are areas where you have pain. A trigger point injection is a shot given in the trigger point to help relieve pain for a few days to a few months. Common places for trigger points include:  The neck.  The shoulders.  The upper back.  The lower back. A trigger point injection will not cure long-lasting (chronic) pain permanently. These injections do not always work for every person, but for some people they can help to relieve pain for a few days to a few months. Tell a health care provider about:  Any allergies you have.  All medicines you are taking, including vitamins, herbs, eye drops, creams, and over-the-counter medicines.  Any problems you or family members have had with anesthetic medicines.  Any blood disorders you have.  Any surgeries you have had.  Any medical conditions you have. What are the risks? Generally, this is a safe procedure. However, problems may occur, including:  Infection.  Bleeding.  Allergic reaction to the injected medicine.  Irritation of the skin around the injection site. What happens before the procedure?  Ask your health care provider about changing or stopping your regular medicines. This is especially important if you are taking diabetes medicines or blood thinners. What happens during the procedure?  Your health care provider will feel for trigger points. A marker may be used to circle the area for the injection.  The skin over the trigger point will be washed with a germ-killing (antiseptic) solution.  A thin needle is used for the shot. You may feel pain or a twitching feeling when the needle enters the trigger point.  A numbing solution may be injected into the trigger point. Sometimes a medicine to keep down swelling, redness, and warmth (inflammation) is also injected.  Your health care provider may move the needle around the area where the trigger point is located until the tightness and  twitching goes away.  After the injection, your health care provider may put gentle pressure over the injection site.  The injection site will be covered with a bandage (dressing). The procedure may vary among health care providers and hospitals. What happens after the procedure?  The dressing can be taken off in a few hours or as told by your health care provider.  You may feel sore and stiff for 1-2 days. This information is not intended to replace advice given to you by your health care provider. Make sure you discuss any questions you have with your health care provider. Document Released: 07/04/2011 Document Revised: 03/17/2016 Document Reviewed: 01/02/2015 Elsevier Interactive Patient Education  2017 [employer]   If the pain in the back of the head on the left side is not improved after trigger point injections, I would recommend left occipital nerve block. Please call to schedule prior to Botox injection

## 2016-11-05 ENCOUNTER — Other Ambulatory Visit: Payer: Self-pay | Admitting: Physical Medicine & Rehabilitation

## 2016-12-20 ENCOUNTER — Telehealth: Payer: Self-pay | Admitting: Physical Medicine & Rehabilitation

## 2016-12-20 ENCOUNTER — Ambulatory Visit (HOSPITAL_BASED_OUTPATIENT_CLINIC_OR_DEPARTMENT_OTHER): Payer: Medicaid Other | Admitting: Physical Medicine & Rehabilitation

## 2016-12-20 ENCOUNTER — Encounter: Payer: Medicaid Other | Attending: Physical Medicine & Rehabilitation

## 2016-12-20 VITALS — BP 134/89 | HR 88

## 2016-12-20 DIAGNOSIS — M791 Myalgia: Secondary | ICD-10-CM | POA: Insufficient documentation

## 2016-12-20 DIAGNOSIS — IMO0002 Reserved for concepts with insufficient information to code with codable children: Secondary | ICD-10-CM

## 2016-12-20 DIAGNOSIS — G43709 Chronic migraine without aura, not intractable, without status migrainosus: Secondary | ICD-10-CM | POA: Diagnosis not present

## 2016-12-20 NOTE — Progress Notes (Signed)
Botox injection for chronic migraines using Allergan protocol plus additional points listed below  Indication: History of migraines with greater than 15 headaches per month despite trials of oral medications.  Informed consent was obtained after discussing risks and benefits of the procedure with the patient this included bleeding bruising infection as well as facial drooping, eyelid lag, systemic effects of Botox. Patient elects to proceed and has given written consent.  Patient placed in a seated position Dilution 50 units per cc  Muscles injected with dose  Procerus 5 units Corrugator 5 units bilateral Frontalis 5 units 2 injection sites on the right and 2 injection sites on the left Temporalis 5 units in 4 injection sites on the right and 4 injection sites on the left Occipitalis 5 units into 3 injections sites on the right and 3 injection sites on the left  Cervical paraspinals 5 units into 2 injection sites on the right and 2 injection sites on the left trapezius 5 units into 3 injection sites on the right and 3 injection sites on the left  10 units, right levator, 10 units. Left levator Wasted, 25 units Patient tolerated procedure well. Post procedure instructions given. Appointment for repeat injection in 3 months Medical management as per neurology 

## 2016-12-20 NOTE — Patient Instructions (Signed)
I recommend starting with a left sacroiliac injection under fluoroscopic guidance. This is a joint between the pelvis and the spine. This is the location of your low back/buttock pain on the left side.  We will likely need to do an L3-L4 epidural on the left side. We would do a transforaminal route, which would not involve putting the needle into the spinal cord area.  If we have residual pain in the low back area, may consider lumbar medial branch blocks, which would block pain coming from the facet joints. The MRI did demonstrate some arthritis in these facet joints

## 2017-01-03 ENCOUNTER — Encounter: Payer: Medicaid Other | Attending: Physical Medicine & Rehabilitation

## 2017-01-03 ENCOUNTER — Ambulatory Visit (HOSPITAL_BASED_OUTPATIENT_CLINIC_OR_DEPARTMENT_OTHER): Payer: Medicaid Other | Admitting: Physical Medicine & Rehabilitation

## 2017-01-03 DIAGNOSIS — M7918 Myalgia, other site: Secondary | ICD-10-CM

## 2017-01-03 DIAGNOSIS — M791 Myalgia: Secondary | ICD-10-CM | POA: Insufficient documentation

## 2017-01-03 NOTE — Progress Notes (Signed)
Trigger Point Injection  Indication: Cervical and periscapular Myofascial pain not relieved by medication management and other conservative care.  Informed consent was obtained after describing risk and benefits of the procedure with the patient, this includes bleeding, bruising, infection and medication side effects.  The patient wishes to proceed and has given written consent.  The patient was placed in a Seated position.  The See below area was marked and prepped with Betadine.  It was entered with a 25-gauge 1-1/2 inch needle and 1 mL of 1% lidocaine was injected into each of 4 trigger points, left trapezius. right trapezius,right levator scapula, right suboccipital after negative draw back for blood.  The patient tolerated the procedure well.  Post procedure instructions were given.

## 2017-01-15 ENCOUNTER — Telehealth: Payer: Self-pay | Admitting: *Deleted

## 2017-01-15 NOTE — Telephone Encounter (Signed)
Peggy Davis called and had questions about her procedure Friday. It is an SI injection.  I explained in detail the procedure and medications used.  She has many allergies and they are anaphylactic in nature so she wanted to know about medications.  I gave her the name of the steroid and she is going to bring her epi pen with her.

## 2017-01-17 ENCOUNTER — Other Ambulatory Visit: Payer: Self-pay

## 2017-01-17 ENCOUNTER — Encounter: Payer: Self-pay | Admitting: Physical Medicine & Rehabilitation

## 2017-01-17 ENCOUNTER — Telehealth: Payer: Self-pay | Admitting: *Deleted

## 2017-01-17 ENCOUNTER — Ambulatory Visit (HOSPITAL_BASED_OUTPATIENT_CLINIC_OR_DEPARTMENT_OTHER): Payer: Medicaid Other | Admitting: Physical Medicine & Rehabilitation

## 2017-01-17 VITALS — BP 151/91 | HR 90

## 2017-01-17 DIAGNOSIS — M791 Myalgia: Secondary | ICD-10-CM | POA: Diagnosis not present

## 2017-01-17 DIAGNOSIS — M259 Joint disorder, unspecified: Secondary | ICD-10-CM | POA: Diagnosis not present

## 2017-01-17 MED ORDER — CYCLOBENZAPRINE HCL 10 MG PO TABS: 10.0000 mg | ORAL_TABLET | Freq: Three times a day (TID) | ORAL | 2 refills | Status: DC | PRN

## 2017-01-17 NOTE — Progress Notes (Signed)
  PROCEDURE RECORD [facility]   Name: Peggy ProvidenceKathiluise Crossan DOB:1960/10/10 MRN: [id number]  Date:01/17/2017  Physician: Claudette LawsAndrew Kirsteins, MD    Nurse/CMA: Greyson Riccardi CMA  Allergies:  Allergies  Allergen Reactions  . Amoxicillin-Pot Clavulanate Anaphylaxis  . Codeine Anaphylaxis  . Cymbalta [Duloxetine Hcl] Anaphylaxis  . Erythromycin Anaphylaxis  . Hydrochlorothiazide Anaphylaxis  . Levaquin [Levofloxacin] Anaphylaxis and Rash  . Morphine And Related Anaphylaxis  . Penicillins Anaphylaxis  . Remeron [Mirtazapine] Anaphylaxis  . Terbutaline Sulfate Anaphylaxis  . Tylox [Oxycodone-Acetaminophen] Anaphylaxis  . Vicodin [Hydrocodone-Acetaminophen] Anaphylaxis  . Wheat Bran Nausea Only    Constipation  . Depakote [Valproic Acid] Rash  . Desyrel [Trazodone] Rash  . Nefazodone Rash  . Zyprexa [Olanzapine]     Consent Signed: Yes.    Is patient diabetic? No.  CBG today? N/A  Pregnant: No. LMP: Patient's last menstrual period was 04/14/2006. (age 55-55)  Anticoagulants: no Anti-inflammatory: no Antibiotics: no  Procedure: left sacroiliac steriod injections Position: Prone   Start Time: 932am End Time: 936am Fluoro Time: 15s  RN/CMA Damoney Julia CMA Adabelle Griffiths CMA    Time 915am 940am    BP 151/91 136/87    Pulse 90 81    Respirations 16 16    O2 Sat 96 99    S/S 6 6    Pain Level 7/10 /10     [facility], patient A & O X 3, D/C instructions reviewed, and sits independently.

## 2017-01-17 NOTE — Progress Notes (Signed)
Left sacroiliac injection under fluoroscopic guidance  Indication: Left Low back and buttocks pain not relieved by medication management and other conservative care.  Informed consent was obtained after describing risks and benefits of the procedure with the patient, this includes bleeding, bruising, infection, paralysis and medication side effects. The patient wishes to proceed and has given written consent. The patient was placed in a prone position. The lumbar and sacral area was marked and prepped with Betadine. A 25-gauge 1-1/2 inch needle was inserted into the skin and subcutaneous tissue and 1 mL of 1% lidocaine was injected. Then a 25-gauge 3 inch spinal needle was inserted under fluoroscopic guidance into the left sacroiliac joint. AP and lateral images were utilized. Omnipaque 180x0.5 mL under live fluoroscopy demonstrated no intravascular uptake. Then a solution containing one ML of 6 mg per mLbetamethasone and 2 ML of 1% lidocaine MPF was injected x1.5 mL. Patient tolerated the procedure well. Post procedure instructions were given. Please see post procedure form.  Preinjection 7/10 Postinjection 0/10, left hip area

## 2017-01-17 NOTE — Patient Instructions (Signed)
Sacroiliac injection was performed today. A combination of a naming medicine plus a cortisone medicine was injected. The injection was done under x-ray guidance. This procedure has been performed to help reduce low back and buttocks pain as well as potentially hip pain. The duration of this injection is variable lasting from hours to  Months. It may repeated if needed. 

## 2017-01-17 NOTE — Telephone Encounter (Signed)
Cadee called to let Dr Wynn BankerKirsteins know that the pain is back after her injection this am.

## 2017-01-17 NOTE — Telephone Encounter (Signed)
Okay. Sounds like a short-term anesthetic phase effect. Still indicates the location of pain. May have to look at other options for longer term relief

## 2017-01-17 NOTE — Telephone Encounter (Signed)
Opened in error

## 2017-01-20 NOTE — Telephone Encounter (Signed)
[facility] notified.

## 2017-01-30 ENCOUNTER — Other Ambulatory Visit: Payer: Self-pay | Admitting: Physical Medicine & Rehabilitation

## 2017-02-18 ENCOUNTER — Encounter: Payer: Medicaid Other | Attending: Physical Medicine & Rehabilitation

## 2017-02-18 ENCOUNTER — Ambulatory Visit (HOSPITAL_BASED_OUTPATIENT_CLINIC_OR_DEPARTMENT_OTHER): Payer: Medicaid Other | Admitting: Physical Medicine & Rehabilitation

## 2017-02-18 VITALS — BP 141/88 | HR 86

## 2017-02-18 DIAGNOSIS — G43709 Chronic migraine without aura, not intractable, without status migrainosus: Secondary | ICD-10-CM

## 2017-02-18 DIAGNOSIS — IMO0002 Reserved for concepts with insufficient information to code with codable children: Secondary | ICD-10-CM

## 2017-02-18 DIAGNOSIS — M791 Myalgia: Secondary | ICD-10-CM

## 2017-02-18 DIAGNOSIS — M7918 Myalgia, other site: Secondary | ICD-10-CM

## 2017-02-18 NOTE — Progress Notes (Signed)
Trigger Point Injection  Indication: Cervical and periscapular Myofascial pain not relieved by medication management and other conservative care.  Informed consent was obtained after describing risk and benefits of the procedure with the patient, this includes bleeding, bruising, infection and medication side effects.  The patient wishes to proceed and has given written consent.  The patient was placed in a Seated position.  The See below area was marked and prepped with Betadine.  It was entered with a 25-gauge 1-1/2 inch needle and 1 mL of 1% lidocaine was injected into each of 7 trigger points, left trapezius x 2. right trapezius,right levator scapula, right suboccipital after negative draw back for blood, right infraspinatus, left infraspinatus.  The patient tolerated the procedure well.  Post procedure instructions were given.

## 2017-02-18 NOTE — Patient Instructions (Signed)
Trigger Point Injection  Trigger points are areas where you have pain. A trigger point injection is a shot given in the trigger point to help relieve pain for a few days to a few months. Common places for trigger points include:  · The neck.  · The shoulders.  · The upper back.  · The lower back.    A trigger point injection will not cure long-lasting (chronic) pain permanently. These injections do not always work for every person, but for some people they can help to relieve pain for a few days to a few months.  Tell a health care provider about:  · Any allergies you have.  · All medicines you are taking, including vitamins, herbs, eye drops, creams, and over-the-counter medicines.  · Any problems you or family members have had with anesthetic medicines.  · Any blood disorders you have.  · Any surgeries you have had.  · Any medical conditions you have.  What are the risks?  Generally, this is a safe procedure. However, problems may occur, including:  · Infection.  · Bleeding.  · Allergic reaction to the injected medicine.  · Irritation of the skin around the injection site.    What happens before the procedure?  · Ask your health care provider about changing or stopping your regular medicines. This is especially important if you are taking diabetes medicines or blood thinners.  What happens during the procedure?  · Your health care provider will feel for trigger points. A marker may be used to circle the area for the injection.  · The skin over the trigger point will be washed with a germ-killing (antiseptic) solution.  · A thin needle is used for the shot. You may feel pain or a twitching feeling when the needle enters the trigger point.  · A numbing solution may be injected into the trigger point. Sometimes a medicine to keep down swelling, redness, and warmth (inflammation) is also injected.  · Your health care provider may move the needle around the area where the trigger point is located until the tightness  and twitching goes away.  · After the injection, your health care provider may put gentle pressure over the injection site.  · The injection site will be covered with a bandage (dressing).  The procedure may vary among health care providers and hospitals.  What happens after the procedure?  · The dressing can be taken off in a few hours or as told by your health care provider.  · You may feel sore and stiff for 1-2 days.  This information is not intended to replace advice given to you by your health care provider. Make sure you discuss any questions you have with your health care provider.  Document Released: 07/04/2011 Document Revised: 03/17/2016 Document Reviewed: 01/02/2015  Elsevier Interactive Patient Education © 2018 [employer]

## 2017-03-24 ENCOUNTER — Encounter: Payer: Medicaid Other | Attending: Physical Medicine & Rehabilitation

## 2017-03-24 ENCOUNTER — Encounter: Payer: Self-pay | Admitting: Physical Medicine & Rehabilitation

## 2017-03-24 ENCOUNTER — Ambulatory Visit (HOSPITAL_BASED_OUTPATIENT_CLINIC_OR_DEPARTMENT_OTHER): Payer: Medicaid Other | Admitting: Physical Medicine & Rehabilitation

## 2017-03-24 VITALS — BP 131/86 | HR 73

## 2017-03-24 DIAGNOSIS — M791 Myalgia: Secondary | ICD-10-CM | POA: Insufficient documentation

## 2017-03-24 DIAGNOSIS — G43709 Chronic migraine without aura, not intractable, without status migrainosus: Secondary | ICD-10-CM | POA: Diagnosis not present

## 2017-03-24 DIAGNOSIS — IMO0002 Reserved for concepts with insufficient information to code with codable children: Secondary | ICD-10-CM

## 2017-03-24 NOTE — Progress Notes (Signed)
Botox injection for chronic migraines using Allergan protocol plus additional points listed below  Indication: History of migraines with greater than 15 headaches per month despite trials of oral medications.  Informed consent was obtained after discussing risks and benefits of the procedure with the patient this included bleeding bruising infection as well as facial drooping, eyelid lag, systemic effects of Botox. Patient elects to proceed and has given written consent.  Patient placed in a seated position Dilution 50 units per cc  Muscles injected with dose  Procerus 5 units Corrugator 5 units bilateral Frontalis 5 units 2 injection sites on the right and 2 injection sites on the left Temporalis 5 units in 4 injection sites on the right and 4 injection sites on the left Occipitalis 5 units into 3 injections sites on the right and 3 injection sites on the left  Cervical paraspinals 5 units into 2 injection sites on the right and 2 injection sites on the left trapezius 5 units into 3 injection sites on the right and 3 injection sites on the left  10 units, right levator, 10 units. Left levator Wasted, 25 units Patient tolerated procedure well. Post procedure instructions given. Appointment for repeat injection in 3 months Medical management as per neurology

## 2017-04-18 ENCOUNTER — Ambulatory Visit (HOSPITAL_BASED_OUTPATIENT_CLINIC_OR_DEPARTMENT_OTHER): Payer: Medicaid Other | Admitting: Physical Medicine & Rehabilitation

## 2017-04-18 ENCOUNTER — Encounter: Payer: Self-pay | Admitting: Physical Medicine & Rehabilitation

## 2017-04-18 ENCOUNTER — Encounter: Payer: Medicaid Other | Attending: Physical Medicine & Rehabilitation

## 2017-04-18 VITALS — BP 132/85 | HR 95

## 2017-04-18 DIAGNOSIS — M791 Myalgia: Secondary | ICD-10-CM

## 2017-04-18 DIAGNOSIS — M7918 Myalgia, other site: Secondary | ICD-10-CM

## 2017-04-18 NOTE — Patient Instructions (Signed)
Trigger Point Injection  Trigger points are areas where you have pain. A trigger point injection is a shot given in the trigger point to help relieve pain for a few days to a few months. Common places for trigger points include:  · The neck.  · The shoulders.  · The upper back.  · The lower back.    A trigger point injection will not cure long-lasting (chronic) pain permanently. These injections do not always work for every person, but for some people they can help to relieve pain for a few days to a few months.  Tell a health care provider about:  · Any allergies you have.  · All medicines you are taking, including vitamins, herbs, eye drops, creams, and over-the-counter medicines.  · Any problems you or family members have had with anesthetic medicines.  · Any blood disorders you have.  · Any surgeries you have had.  · Any medical conditions you have.  What are the risks?  Generally, this is a safe procedure. However, problems may occur, including:  · Infection.  · Bleeding.  · Allergic reaction to the injected medicine.  · Irritation of the skin around the injection site.    What happens before the procedure?  · Ask your health care provider about changing or stopping your regular medicines. This is especially important if you are taking diabetes medicines or blood thinners.  What happens during the procedure?  · Your health care provider will feel for trigger points. A marker may be used to circle the area for the injection.  · The skin over the trigger point will be washed with a germ-killing (antiseptic) solution.  · A thin needle is used for the shot. You may feel pain or a twitching feeling when the needle enters the trigger point.  · A numbing solution may be injected into the trigger point. Sometimes a medicine to keep down swelling, redness, and warmth (inflammation) is also injected.  · Your health care provider may move the needle around the area where the trigger point is located until the tightness  and twitching goes away.  · After the injection, your health care provider may put gentle pressure over the injection site.  · The injection site will be covered with a bandage (dressing).  The procedure may vary among health care providers and hospitals.  What happens after the procedure?  · The dressing can be taken off in a few hours or as told by your health care provider.  · You may feel sore and stiff for 1-2 days.  This information is not intended to replace advice given to you by your health care provider. Make sure you discuss any questions you have with your health care provider.  Document Released: 07/04/2011 Document Revised: 03/17/2016 Document Reviewed: 01/02/2015  Elsevier Interactive Patient Education © 2018 [employer]

## 2017-04-18 NOTE — Progress Notes (Signed)
Trigger Point Injection  Indication: Cervical and periscapular Myofascial pain not relieved by medication management and other conservative care.  Informed consent was obtained after describing risk and benefits of the procedure with the patient, this includes bleeding, bruising, infection and medication side effects.  The patient wishes to proceed and has given written consent.  The patient was placed in a Seated position.  The See below area was marked and prepped with Betadine.  It was entered with a 25-gauge 1-1/2 inch needle and 1/2 mL of 1% lidocaine was injected into each of 8 trigger points, left trapezius x 2. right trapezius,right levator scapula, right and left suboccipital after negative draw back for blood, right infraspinatus, left infraspinatus.  The patient tolerated the procedure well.  Post procedure instructions were given.

## 2017-06-10 ENCOUNTER — Other Ambulatory Visit: Payer: Self-pay | Admitting: Physical Medicine & Rehabilitation

## 2017-06-13 ENCOUNTER — Other Ambulatory Visit: Payer: Self-pay | Admitting: *Deleted

## 2017-06-13 MED ORDER — CYCLOBENZAPRINE HCL 10 MG PO TABS: 10.0000 mg | ORAL_TABLET | Freq: Three times a day (TID) | ORAL | 1 refills | Status: DC | PRN

## 2017-06-23 ENCOUNTER — Ambulatory Visit: Payer: Medicaid Other | Admitting: Physical Medicine & Rehabilitation

## 2017-06-23 ENCOUNTER — Encounter: Payer: Medicaid Other | Attending: Physical Medicine & Rehabilitation

## 2017-06-23 ENCOUNTER — Encounter: Payer: Self-pay | Admitting: Physical Medicine & Rehabilitation

## 2017-06-23 ENCOUNTER — Other Ambulatory Visit: Payer: Self-pay

## 2017-06-23 VITALS — BP 142/92 | HR 92 | Resp 16

## 2017-06-23 DIAGNOSIS — M7918 Myalgia, other site: Secondary | ICD-10-CM | POA: Insufficient documentation

## 2017-06-23 NOTE — Patient Instructions (Signed)
Trigger point injections today trapezius, levator, infraspinatus, suboccipital

## 2017-06-23 NOTE — Progress Notes (Signed)
Trigger Point Injection  Indication: Cervical and periscapular Myofascial pain not relieved by medication management and other conservative care.  Informed consent was obtained after describing risk and benefits of the procedure with the patient, this includes bleeding, bruising, infection and medication side effects.  The patient wishes to proceed and has given written consent.  The patient was placed in a Seated position.  The See below area was marked and prepped with Betadine.  It was entered with a 25-gauge 1-1/2 inch needle and 1 mL of 1% lidocaine was injected into each of 7 trigger points, left trapezius . right trapezius,right levator scapula, and .5ml 1 % Lidocaine in each  suboccipital after negative draw back for blood, right infraspinatus, .  The patient tolerated the procedure well.  Post procedure instructions were given.

## 2017-06-24 ENCOUNTER — Encounter: Payer: Self-pay | Admitting: Physical Medicine & Rehabilitation

## 2017-06-24 ENCOUNTER — Ambulatory Visit: Payer: Medicaid Other | Admitting: Physical Medicine & Rehabilitation

## 2017-06-24 VITALS — BP 152/92 | HR 95 | Resp 14

## 2017-06-24 DIAGNOSIS — G43709 Chronic migraine without aura, not intractable, without status migrainosus: Secondary | ICD-10-CM

## 2017-06-24 DIAGNOSIS — M7918 Myalgia, other site: Secondary | ICD-10-CM | POA: Diagnosis not present

## 2017-06-24 DIAGNOSIS — IMO0002 Reserved for concepts with insufficient information to code with codable children: Secondary | ICD-10-CM

## 2017-06-24 NOTE — Patient Instructions (Signed)
You received a botulinum toxin injection for chronic headache prevention. You may have soreness in your injection site, you may use ice for 20-30 minutes every 2 hours to help with soreness.  Maximum effect of the injection will be at around 1 week. Other side effects include possible muscle weakness which will subside as the medication wears off.  Medication may be repeated every 3 months as needed. 

## 2017-06-24 NOTE — Progress Notes (Signed)
Botox injection for chronic migraines using Allergan protocol plus additional points listed below  Indication: History of migraines with greater than 15 headaches per month despite trials of oral medications.  Informed consent was obtained after discussing risks and benefits of the procedure with the patient this included bleeding bruising infection as well as facial drooping, eyelid lag, systemic effects of Botox. Patient elects to proceed and has given written consent.  Patient placed in a seated position Dilution 50 units per cc  Muscles injected with dose  Procerus 5 units Corrugator 5 units bilateral Frontalis 5 units 2 injection sites on the right and 2 injection sites on the left Temporalis 5 units in 4 injection sites on the right and 4 injection sites on the left Occipitalis 5 units into 3 injections sites on the right and 3 injection sites on the left  Cervical paraspinals 5 units into 2 injection sites on the right and 2 injection sites on the left trapezius 5 units into 3 injection sites on the right and 3 injection sites on the left  Wasted, 45 units Patient tolerated procedure well. Post procedure instructions given. Appointment for repeat injection in 3 months Medical management as per neurology

## 2017-08-04 ENCOUNTER — Other Ambulatory Visit: Payer: Self-pay | Admitting: Physical Medicine & Rehabilitation

## 2017-08-22 ENCOUNTER — Encounter: Payer: Medicaid Other | Attending: Physical Medicine & Rehabilitation

## 2017-08-22 ENCOUNTER — Ambulatory Visit: Payer: Medicaid Other | Admitting: Physical Medicine & Rehabilitation

## 2017-08-22 ENCOUNTER — Encounter: Payer: Self-pay | Admitting: Physical Medicine & Rehabilitation

## 2017-08-22 VITALS — BP 157/90 | HR 84 | Resp 14

## 2017-08-22 DIAGNOSIS — M7918 Myalgia, other site: Secondary | ICD-10-CM | POA: Diagnosis not present

## 2017-08-22 NOTE — Progress Notes (Signed)
Trigger Point Injection  Indication: Cervical Myofascial pain not relieved by medication management and other conservative care.  Informed consent was obtained after describing risk and benefits of the procedure with the patient, this includes bleeding, bruising, infection and medication side effects.  The patient wishes to proceed and has given written consent.  The patient was placed in a Seated position.  Bilateral suboccipital Left trap Right levator Left Cervical paraspinal C4  area was marked and prepped with Betadine.  entered with a 25-gauge 1-1/2 inch needle and 1 mL of 1% lidocaine was injected into each of 5 trigger points, after negative draw back for blood.  The patient tolerated the procedure well.  Post procedure instructions were given.

## 2017-09-08 ENCOUNTER — Ambulatory Visit: Payer: Medicaid Other

## 2017-09-08 ENCOUNTER — Ambulatory Visit: Payer: Medicaid Other | Admitting: Physical Medicine & Rehabilitation

## 2017-09-15 ENCOUNTER — Encounter: Payer: Medicaid Other | Attending: Physical Medicine & Rehabilitation

## 2017-09-15 ENCOUNTER — Encounter: Payer: Self-pay | Admitting: Physical Medicine & Rehabilitation

## 2017-09-15 ENCOUNTER — Ambulatory Visit: Payer: Medicaid Other | Admitting: Physical Medicine & Rehabilitation

## 2017-09-15 ENCOUNTER — Ambulatory Visit: Payer: Medicaid Other

## 2017-09-15 VITALS — BP 145/81 | HR 88

## 2017-09-15 DIAGNOSIS — G43709 Chronic migraine without aura, not intractable, without status migrainosus: Secondary | ICD-10-CM | POA: Diagnosis not present

## 2017-09-15 DIAGNOSIS — M7918 Myalgia, other site: Secondary | ICD-10-CM | POA: Diagnosis not present

## 2017-09-15 DIAGNOSIS — IMO0002 Reserved for concepts with insufficient information to code with codable children: Secondary | ICD-10-CM

## 2017-09-15 NOTE — Progress Notes (Signed)
Botox injection for chronic migraines using Allergan protocol plus additional points listed below  Indication: History of migraines with greater than 15 headaches per month despite trials of oral medications. G 43.709  Informed consent was obtained after discussing risks and benefits of the procedure with the patient this included bleeding bruising infection as well as facial drooping, eyelid lag, systemic effects of Botox. Patient elects to proceed and has given written consent.  Patient placed in a seated position Dilution 50 units per cc  Muscles injected with dose  Procerus 5 units Corrugator 5 units bilateral Frontalis 5 units 2 injection sites on the right and 2 injection sites on the left Temporalis 5 units in 4 injection sites on the right and 4 injection sites on the left Occipitalis 5 units into 3 injections sites on the right and 3 injection sites on the left  Cervical paraspinals 5 units into 2 injection sites on the right and 2 injection sites on the left trapezius 5 units into 3 injection sites on the right and 3 injection sites on the left Bilaterl splenius cap 5 Right Infraspinatus 10U Right Levator scap 10U Patient tolerated procedure well. Post procedure instructions given. Appointment for repeat injection in 3 months Medical management as per neurology

## 2017-09-26 ENCOUNTER — Encounter: Payer: Medicaid Other | Attending: Physical Medicine & Rehabilitation

## 2017-09-26 ENCOUNTER — Ambulatory Visit: Payer: Medicaid Other | Admitting: Physical Medicine & Rehabilitation

## 2017-09-26 DIAGNOSIS — M7918 Myalgia, other site: Secondary | ICD-10-CM | POA: Insufficient documentation

## 2017-09-29 ENCOUNTER — Other Ambulatory Visit: Payer: Self-pay | Admitting: Physical Medicine & Rehabilitation

## 2017-10-09 ENCOUNTER — Ambulatory Visit (HOSPITAL_BASED_OUTPATIENT_CLINIC_OR_DEPARTMENT_OTHER): Payer: Medicaid Other | Admitting: Physical Medicine & Rehabilitation

## 2017-10-09 ENCOUNTER — Encounter: Payer: Self-pay | Admitting: Physical Medicine & Rehabilitation

## 2017-10-09 VITALS — BP 135/82 | HR 86

## 2017-10-09 DIAGNOSIS — M7918 Myalgia, other site: Secondary | ICD-10-CM | POA: Diagnosis not present

## 2017-10-09 NOTE — Patient Instructions (Signed)
Trigger Point Injection  Trigger points are areas where you have pain. A trigger point injection is a shot given in the trigger point to help relieve pain for a few days to a few months. Common places for trigger points include:  · The neck.  · The shoulders.  · The upper back.  · The lower back.    A trigger point injection will not cure long-lasting (chronic) pain permanently. These injections do not always work for every person, but for some people they can help to relieve pain for a few days to a few months.  Tell a health care provider about:  · Any allergies you have.  · All medicines you are taking, including vitamins, herbs, eye drops, creams, and over-the-counter medicines.  · Any problems you or family members have had with anesthetic medicines.  · Any blood disorders you have.  · Any surgeries you have had.  · Any medical conditions you have.  What are the risks?  Generally, this is a safe procedure. However, problems may occur, including:  · Infection.  · Bleeding.  · Allergic reaction to the injected medicine.  · Irritation of the skin around the injection site.    What happens before the procedure?  · Ask your health care provider about changing or stopping your regular medicines. This is especially important if you are taking diabetes medicines or blood thinners.  What happens during the procedure?  · Your health care provider will feel for trigger points. A marker may be used to circle the area for the injection.  · The skin over the trigger point will be washed with a germ-killing (antiseptic) solution.  · A thin needle is used for the shot. You may feel pain or a twitching feeling when the needle enters the trigger point.  · A numbing solution may be injected into the trigger point. Sometimes a medicine to keep down swelling, redness, and warmth (inflammation) is also injected.  · Your health care provider may move the needle around the area where the trigger point is located until the tightness  and twitching goes away.  · After the injection, your health care provider may put gentle pressure over the injection site.  · The injection site will be covered with a bandage (dressing).  The procedure may vary among health care providers and hospitals.  What happens after the procedure?  · The dressing can be taken off in a few hours or as told by your health care provider.  · You may feel sore and stiff for 1-2 days.  This information is not intended to replace advice given to you by your health care provider. Make sure you discuss any questions you have with your health care provider.  Document Released: 07/04/2011 Document Revised: 03/17/2016 Document Reviewed: 01/02/2015  Elsevier Interactive Patient Education © 2018 [employer]

## 2017-10-09 NOTE — Progress Notes (Signed)
UDS not ordered for patient.  Last controlled medication prescription was prescribed over 2 years ago.  Patient stated not currently taking opioid medications of any kind for over a year other then pre-procedure medication.  No CSA or opioid risk score in chart.

## 2017-10-09 NOTE — Progress Notes (Signed)
Trigger Point Injection  Indication: Cervical and periscapular Myofascial pain not relieved by medication management and other conservative care.  Informed consent was obtained after describing risk and benefits of the procedure with the patient, this includes bleeding, bruising, infection and medication side effects.  The patient wishes to proceed and has given written consent.  The patient was placed in a Seated position.  The See below area was marked and prepped with Betadine.  It was entered with a 25-gauge 1-1/2 inch needle and 1 mL of 1% lidocaine was injected into each of 7 trigger points, left trapezius . right trapezius,right and left levator scapula,right and left infraspinatus and .5ml 1 % Lidocaine in right suboccipital after negative draw back for blood, right infraspinatus, .  The patient tolerated the procedure well.  Post procedure instructions were given.

## 2017-10-09 NOTE — Addendum Note (Signed)
Addended by: Erick ColaceKIRSTEINS, ANDREW E on: 10/09/2017 09:29 AM   Modules accepted: Orders

## 2017-11-25 ENCOUNTER — Other Ambulatory Visit: Payer: Self-pay | Admitting: Physical Medicine & Rehabilitation

## 2017-12-02 ENCOUNTER — Encounter: Payer: Self-pay | Admitting: Physical Medicine & Rehabilitation

## 2017-12-02 ENCOUNTER — Ambulatory Visit: Payer: Medicaid Other | Admitting: Physical Medicine & Rehabilitation

## 2017-12-02 ENCOUNTER — Encounter: Payer: Medicaid Other | Attending: Physical Medicine & Rehabilitation

## 2017-12-02 DIAGNOSIS — M7918 Myalgia, other site: Secondary | ICD-10-CM | POA: Diagnosis not present

## 2017-12-02 NOTE — Patient Instructions (Signed)
Trigger Point Injection  Trigger points are areas where you have pain. A trigger point injection is a shot given in the trigger point to help relieve pain for a few days to a few months. Common places for trigger points include:  · The neck.  · The shoulders.  · The upper back.  · The lower back.    A trigger point injection will not cure long-lasting (chronic) pain permanently. These injections do not always work for every person, but for some people they can help to relieve pain for a few days to a few months.  Tell a health care provider about:  · Any allergies you have.  · All medicines you are taking, including vitamins, herbs, eye drops, creams, and over-the-counter medicines.  · Any problems you or family members have had with anesthetic medicines.  · Any blood disorders you have.  · Any surgeries you have had.  · Any medical conditions you have.  What are the risks?  Generally, this is a safe procedure. However, problems may occur, including:  · Infection.  · Bleeding.  · Allergic reaction to the injected medicine.  · Irritation of the skin around the injection site.    What happens before the procedure?  · Ask your health care provider about changing or stopping your regular medicines. This is especially important if you are taking diabetes medicines or blood thinners.  What happens during the procedure?  · Your health care provider will feel for trigger points. A marker may be used to circle the area for the injection.  · The skin over the trigger point will be washed with a germ-killing (antiseptic) solution.  · A thin needle is used for the shot. You may feel pain or a twitching feeling when the needle enters the trigger point.  · A numbing solution may be injected into the trigger point. Sometimes a medicine to keep down swelling, redness, and warmth (inflammation) is also injected.  · Your health care provider may move the needle around the area where the trigger point is located until the tightness  and twitching goes away.  · After the injection, your health care provider may put gentle pressure over the injection site.  · The injection site will be covered with a bandage (dressing).  The procedure may vary among health care providers and hospitals.  What happens after the procedure?  · The dressing can be taken off in a few hours or as told by your health care provider.  · You may feel sore and stiff for 1-2 days.  This information is not intended to replace advice given to you by your health care provider. Make sure you discuss any questions you have with your health care provider.  Document Released: 07/04/2011 Document Revised: 03/17/2016 Document Reviewed: 01/02/2015  Elsevier Interactive Patient Education © 2018 [employer]

## 2017-12-02 NOTE — Progress Notes (Signed)
Trigger Point Injection  Indication: Cervical and periscapular Myofascial pain not relieved by medication management and other conservative care.  Informed consent was obtained after describing risk and benefits of the procedure with the patient, this includes bleeding, bruising, infection and medication side effects.  The patient wishes to proceed and has given written consent.  The patient was placed in a Seated position.  The See below area was marked and prepped with Betadine.  It was entered with a 25-gauge 1-1/2 inch needle and 1 mL of 1% lidocaine was injected into each of 7 trigger points, left trapezius . right trapezius,right and left levator scapula,right and left infraspinatus and .5ml 1 % Lidocaine in right suboccipital after negative draw back for blood, right infraspinatus, 2ml in Right L2 paraspinal.  The patient tolerated the procedure well.  Post procedure instructions were given.

## 2017-12-12 ENCOUNTER — Ambulatory Visit: Payer: Medicaid Other | Admitting: Physical Medicine & Rehabilitation

## 2017-12-12 ENCOUNTER — Ambulatory Visit: Payer: Medicaid Other

## 2017-12-16 ENCOUNTER — Ambulatory Visit: Payer: Medicaid Other | Admitting: Physical Medicine & Rehabilitation

## 2017-12-16 ENCOUNTER — Encounter: Payer: Self-pay | Admitting: Physical Medicine & Rehabilitation

## 2017-12-16 ENCOUNTER — Other Ambulatory Visit: Payer: Self-pay

## 2017-12-16 VITALS — BP 144/97 | HR 104 | Ht 65.0 in | Wt 140.0 lb

## 2017-12-16 DIAGNOSIS — IMO0002 Reserved for concepts with insufficient information to code with codable children: Secondary | ICD-10-CM

## 2017-12-16 DIAGNOSIS — G43709 Chronic migraine without aura, not intractable, without status migrainosus: Secondary | ICD-10-CM | POA: Diagnosis not present

## 2017-12-16 DIAGNOSIS — M7918 Myalgia, other site: Secondary | ICD-10-CM | POA: Diagnosis not present

## 2017-12-16 NOTE — Patient Instructions (Signed)

## 2017-12-16 NOTE — Progress Notes (Signed)
Botox injection for chronic migraines using Allergan protocol plus additional points listed below  Indication: History of migraines with greater than 15 headaches per month despite trials of oral medications. G 43.709  Informed consent was obtained after discussing risks and benefits of the procedure with the patient this included bleeding bruising infection as well as facial drooping, eyelid lag, systemic effects of Botox. Patient elects to proceed and has given written consent.  Patient placed in a seated position Dilution 50 units per cc  Muscles injected with dose  Procerus 5 units Corrugator 5 units bilateral Frontalis 5 units 2 injection sites on the right and 2 injection sites on the left Temporalis 5 units in 4 injection sites on the right and 4 injection sites on the left Occipitalis 5 units into 3 injections sites on the right and 3 injection sites on the left  Cervical paraspinals 5 units into 2 injection sites on the right and 2 injection sites on the left trapezius 5 units into 3 injection sites on the right and 3 injection sites on the left Bilaterl splenius cap 5 Right Infraspinatus 10U Right Levator scap 10U Patient tolerated procedure well. Post procedure instructions given. Appointment for repeat injection in 3 months Medical management as per neurology

## 2018-01-20 ENCOUNTER — Other Ambulatory Visit: Payer: Self-pay | Admitting: Physical Medicine & Rehabilitation

## 2018-02-02 ENCOUNTER — Ambulatory Visit: Payer: Medicaid Other

## 2018-02-02 ENCOUNTER — Ambulatory Visit: Payer: Medicaid Other | Admitting: Physical Medicine & Rehabilitation

## 2018-02-09 ENCOUNTER — Other Ambulatory Visit: Payer: Self-pay

## 2018-02-09 ENCOUNTER — Encounter: Payer: Medicaid Other | Attending: Physical Medicine & Rehabilitation

## 2018-02-09 ENCOUNTER — Ambulatory Visit: Payer: Medicaid Other | Admitting: Physical Medicine & Rehabilitation

## 2018-02-09 ENCOUNTER — Encounter: Payer: Self-pay | Admitting: Physical Medicine & Rehabilitation

## 2018-02-09 VITALS — BP 143/92 | HR 88 | Ht 65.0 in | Wt 138.2 lb

## 2018-02-09 DIAGNOSIS — M7918 Myalgia, other site: Secondary | ICD-10-CM | POA: Diagnosis not present

## 2018-02-09 NOTE — Progress Notes (Signed)
Trigger Point Injection  Indication: Cervical and periscapular Myofascial pain not relieved by medication management and other conservative care.  Informed consent was obtained after describing risk and benefits of the procedure with the patient, this includes bleeding, bruising, infection and medication side effects.  The patient wishes to proceed and has given written consent.  The patient was placed in a Seated position.  The See below area was marked and prepped with Betadine.  It was entered with a 25-gauge 1-1/2 inch needle and 1/2 mL of 1% lidocaine was injected into each of 9 trigger points, left trapezius . right trapezius,right and left levator scapula,right and left infraspinatus and .5ml 1 % Lidocaine in Left suboccipital after negative draw back for blood,The patient tolerated the procedure well.  Post procedure instructions were given.

## 2018-03-19 ENCOUNTER — Ambulatory Visit: Payer: Medicaid Other | Admitting: Physical Medicine & Rehabilitation

## 2018-03-23 ENCOUNTER — Other Ambulatory Visit: Payer: Self-pay | Admitting: Physical Medicine & Rehabilitation

## 2018-03-26 ENCOUNTER — Ambulatory Visit: Payer: Medicaid Other | Admitting: Physical Medicine & Rehabilitation

## 2018-03-26 ENCOUNTER — Encounter: Payer: Self-pay | Admitting: Physical Medicine & Rehabilitation

## 2018-03-26 ENCOUNTER — Other Ambulatory Visit: Payer: Self-pay

## 2018-03-26 ENCOUNTER — Encounter: Payer: Medicaid Other | Attending: Physical Medicine & Rehabilitation

## 2018-03-26 VITALS — BP 138/88 | HR 87 | Ht 65.0 in | Wt 138.6 lb

## 2018-03-26 DIAGNOSIS — G43709 Chronic migraine without aura, not intractable, without status migrainosus: Secondary | ICD-10-CM

## 2018-03-26 DIAGNOSIS — M7918 Myalgia, other site: Secondary | ICD-10-CM | POA: Diagnosis present

## 2018-03-26 DIAGNOSIS — IMO0002 Reserved for concepts with insufficient information to code with codable children: Secondary | ICD-10-CM

## 2018-03-26 NOTE — Progress Notes (Signed)
Botox injection for chronic migraines using Allergan protocol plus additional points listed below  Indication: History of migraines with greater than 15 headaches per month despite trials of oral medications. G 43.709  Informed consent was obtained after discussing risks and benefits of the procedure with the patient this included bleeding bruising infection as well as facial drooping, eyelid lag, systemic effects of Botox. Patient elects to proceed and has given written consent.  Patient placed in a seated position Dilution 50 units per cc  Muscles injected with dose  Procerus 5 units Corrugator 5 units bilateral Frontalis 5 units 2 injection sites on the right and 2 injection sites on the left Temporalis 5 units in 4 injection sites on the right and 4 injection sites on the left Occipitalis 5 units into 3 injections sites on the right and 3 injection sites on the left  Cervical paraspinals 5 units into 2 injection sites on the right and 2 injection sites on the left trapezius 5 units into 3 injection sites on the right and 3 injection sites on the left Bilaterl splenius cap 5 Right Infraspinatus 10U Right Levator scap 10U Left levator scap 5U Left Infraspinatus 5U Patient tolerated procedure well. Post procedure instructions given. Appointment for repeat injection in 3 months Medical management as per neurology

## 2018-03-26 NOTE — Patient Instructions (Signed)

## 2018-04-13 ENCOUNTER — Encounter: Payer: Self-pay | Admitting: Physical Medicine & Rehabilitation

## 2018-04-13 ENCOUNTER — Encounter: Payer: Medicaid Other | Attending: Physical Medicine & Rehabilitation

## 2018-04-13 ENCOUNTER — Other Ambulatory Visit: Payer: Self-pay

## 2018-04-13 ENCOUNTER — Ambulatory Visit: Payer: Medicaid Other | Admitting: Physical Medicine & Rehabilitation

## 2018-04-13 VITALS — BP 154/93 | HR 96 | Ht 65.0 in | Wt 141.8 lb

## 2018-04-13 DIAGNOSIS — M7918 Myalgia, other site: Secondary | ICD-10-CM | POA: Diagnosis not present

## 2018-04-13 NOTE — Progress Notes (Signed)
Trigger Point Injection  Indication: Cervical and periscapular Myofascial pain not relieved by medication management and other conservative care.  Informed consent was obtained after describing risk and benefits of the procedure with the patient, this includes bleeding, bruising, infection and medication side effects.  The patient wishes to proceed and has given written consent.  The patient was placed in a Seated position.  The See below area was marked and prepped with Betadine.  It was entered with a 25-gauge 1-1/2 inch needle and 1/2 mL of 1% lidocaine was injected into each of 7 trigger points, left trapezius . right trapezius,right  levator scapula,right and left infraspinatus and .5ml 1 % Lidocaine in right suboccipital , Right T 8 paraspinalafter negative draw back for blood,The patient tolerated the procedure well.  Post procedure instructions were given.

## 2018-05-20 ENCOUNTER — Other Ambulatory Visit: Payer: Self-pay | Admitting: Physical Medicine & Rehabilitation

## 2018-06-08 ENCOUNTER — Ambulatory Visit: Payer: Medicaid Other | Admitting: Physical Medicine & Rehabilitation

## 2018-06-08 ENCOUNTER — Encounter: Payer: Medicaid Other | Attending: Physical Medicine & Rehabilitation

## 2018-06-08 ENCOUNTER — Encounter: Payer: Self-pay | Admitting: Physical Medicine & Rehabilitation

## 2018-06-08 VITALS — BP 137/89 | HR 89 | Ht 65.0 in | Wt 141.0 lb

## 2018-06-08 DIAGNOSIS — M7918 Myalgia, other site: Secondary | ICD-10-CM | POA: Insufficient documentation

## 2018-06-08 NOTE — Patient Instructions (Signed)
Trigger Point Injection  Trigger points are areas where you have pain. A trigger point injection is a shot given in the trigger point to help relieve pain for a few days to a few months. Common places for trigger points include:  · The neck.  · The shoulders.  · The upper back.  · The lower back.    A trigger point injection will not cure long-lasting (chronic) pain permanently. These injections do not always work for every person, but for some people they can help to relieve pain for a few days to a few months.  Tell a health care provider about:  · Any allergies you have.  · All medicines you are taking, including vitamins, herbs, eye drops, creams, and over-the-counter medicines.  · Any problems you or family members have had with anesthetic medicines.  · Any blood disorders you have.  · Any surgeries you have had.  · Any medical conditions you have.  What are the risks?  Generally, this is a safe procedure. However, problems may occur, including:  · Infection.  · Bleeding.  · Allergic reaction to the injected medicine.  · Irritation of the skin around the injection site.    What happens before the procedure?  · Ask your health care provider about changing or stopping your regular medicines. This is especially important if you are taking diabetes medicines or blood thinners.  What happens during the procedure?  · Your health care provider will feel for trigger points. A marker may be used to circle the area for the injection.  · The skin over the trigger point will be washed with a germ-killing (antiseptic) solution.  · A thin needle is used for the shot. You may feel pain or a twitching feeling when the needle enters the trigger point.  · A numbing solution may be injected into the trigger point. Sometimes a medicine to keep down swelling, redness, and warmth (inflammation) is also injected.  · Your health care provider may move the needle around the area where the trigger point is located until the tightness  and twitching goes away.  · After the injection, your health care provider may put gentle pressure over the injection site.  · The injection site will be covered with a bandage (dressing).  The procedure may vary among health care providers and hospitals.  What happens after the procedure?  · The dressing can be taken off in a few hours or as told by your health care provider.  · You may feel sore and stiff for 1-2 days.  This information is not intended to replace advice given to you by your health care provider. Make sure you discuss any questions you have with your health care provider.  Document Released: 07/04/2011 Document Revised: 03/17/2016 Document Reviewed: 01/02/2015  Elsevier Interactive Patient Education © 2018 [employer]

## 2018-06-08 NOTE — Progress Notes (Signed)
Trigger Point Injection  Indication: Cervical and periscapular Myofascial pain not relieved by medication management and other conservative care.  Informed consent was obtained after describing risk and benefits of the procedure with the patient, this includes bleeding, bruising, infection and medication side effects.  The patient wishes to proceed and has given written consent.  The patient was placed in a Seated position.  The See below area was marked and prepped with Betadine.  It was entered with a 25-gauge 1-1/2 inch needle and 1/2 mL of 1% lidocaine was injected into each of 7 trigger points, left trapezius . right trapezius,right and left   levator scapula,right and left infraspinatus and .5ml 1 % Lidocaine in right suboccipital , The patient tolerated the procedure well.  Post procedure instructions were given. 

## 2018-06-23 ENCOUNTER — Encounter: Payer: Self-pay | Admitting: Physical Medicine & Rehabilitation

## 2018-06-23 ENCOUNTER — Ambulatory Visit: Payer: Medicaid Other | Admitting: Physical Medicine & Rehabilitation

## 2018-06-23 VITALS — BP 128/83 | HR 93 | Resp 14 | Ht 65.0 in | Wt 137.0 lb

## 2018-06-23 DIAGNOSIS — M7918 Myalgia, other site: Secondary | ICD-10-CM | POA: Diagnosis not present

## 2018-06-23 DIAGNOSIS — G43709 Chronic migraine without aura, not intractable, without status migrainosus: Secondary | ICD-10-CM | POA: Diagnosis not present

## 2018-06-23 DIAGNOSIS — IMO0002 Reserved for concepts with insufficient information to code with codable children: Secondary | ICD-10-CM

## 2018-06-23 NOTE — Patient Instructions (Signed)

## 2018-06-23 NOTE — Progress Notes (Signed)
Botox injection for chronic migraines using Allergan protocol plus additional points listed below  Indication: History of migraines with greater than 15 headaches per month despite trials of oral medications. G 43.709  Informed consent was obtained after discussing risks and benefits of the procedure with the patient this included bleeding bruising infection as well as facial drooping, eyelid lag, systemic effects of Botox. Patient elects to proceed and has given written consent.  Patient placed in a seated position Dilution 50 units per cc  Muscles injected with dose  Procerus 5 units Corrugator 5 units bilateral Frontalis 5 units 2 injection sites on the right and 2 injection sites on the left Temporalis 5 units in 4 injection sites on the right and 4 injection sites on the left Occipitalis 5 units into 3 injections sites on the right and 3 injection sites on the left  Cervical paraspinals 5 units into 2 injection sites on the right and 2 injection sites on the left trapezius 5 units into 3 injection sites on the right and 3 injection sites on the left Bilateral splenius cap 5 Right Infraspinatus 10U Right Levator scap 10U Left levator scap 5U Left Infraspinatus 5U Patient tolerated procedure well. Post procedure instructions given. Appointment for repeat injection in 3 months Medical management as per neurology

## 2018-07-21 ENCOUNTER — Other Ambulatory Visit: Payer: Self-pay | Admitting: Physical Medicine & Rehabilitation

## 2018-08-03 ENCOUNTER — Ambulatory Visit: Payer: Medicaid Other | Admitting: Physical Medicine & Rehabilitation

## 2018-08-03 ENCOUNTER — Encounter: Payer: Self-pay | Admitting: Physical Medicine & Rehabilitation

## 2018-08-03 ENCOUNTER — Encounter: Payer: Medicaid Other | Attending: Physical Medicine & Rehabilitation

## 2018-08-03 ENCOUNTER — Other Ambulatory Visit: Payer: Self-pay

## 2018-08-03 VITALS — BP 156/95 | HR 98 | Ht 65.0 in | Wt 138.8 lb

## 2018-08-03 DIAGNOSIS — M7918 Myalgia, other site: Secondary | ICD-10-CM | POA: Insufficient documentation

## 2018-08-03 NOTE — Progress Notes (Signed)
Trigger Point Injection  Indication: Cervical and periscapular Myofascial pain not relieved by medication management and other conservative care.  Informed consent was obtained after describing risk and benefits of the procedure with the patient, this includes bleeding, bruising, infection and medication side effects.  The patient wishes to proceed and has given written consent.  The patient was placed in a Seated position.  The See below area was marked and prepped with Betadine.  It was entered with a 25-gauge 1-1/2 inch needle and 1/2 mL of 1% lidocaine was injected into each of 7 trigger points, left trapezius . right trapezius,right and left   levator scapula,right and left infraspinatus and .14ml 1 % Lidocaine in right suboccipital , The patient tolerated the procedure well.  Post procedure instructions were given.

## 2018-09-23 ENCOUNTER — Other Ambulatory Visit: Payer: Self-pay | Admitting: Physical Medicine & Rehabilitation

## 2018-09-25 ENCOUNTER — Encounter: Payer: Self-pay | Admitting: Physical Medicine & Rehabilitation

## 2018-09-25 ENCOUNTER — Encounter: Payer: Medicaid Other | Attending: Physical Medicine & Rehabilitation

## 2018-09-25 ENCOUNTER — Ambulatory Visit: Payer: Medicaid Other | Admitting: Physical Medicine & Rehabilitation

## 2018-09-25 ENCOUNTER — Other Ambulatory Visit: Payer: Self-pay

## 2018-09-25 VITALS — BP 167/96 | HR 87 | Ht 65.0 in | Wt 143.0 lb

## 2018-09-25 DIAGNOSIS — G43709 Chronic migraine without aura, not intractable, without status migrainosus: Secondary | ICD-10-CM

## 2018-09-25 DIAGNOSIS — M7918 Myalgia, other site: Secondary | ICD-10-CM | POA: Insufficient documentation

## 2018-09-25 DIAGNOSIS — IMO0002 Reserved for concepts with insufficient information to code with codable children: Secondary | ICD-10-CM

## 2018-09-25 NOTE — Patient Instructions (Signed)

## 2018-09-25 NOTE — Progress Notes (Signed)
Botox injection for chronic migraines using Allergan protocol plus additional points listed below  Indication: History of migraines with greater than 15 headaches per month despite trials of oral medications. G 43.709  Informed consent was obtained after discussing risks and benefits of the procedure with the patient this included bleeding bruising infection as well as facial drooping, eyelid lag, systemic effects of Botox. Patient elects to proceed and has given written consent.  Patient placed in a seated position Dilution 50 units per cc  Muscles injected with dose  Procerus 5 units Corrugator 5 units bilateral Frontalis 5 units 2 injection sites on the right and 2 injection sites on the left Temporalis 5 units in 4 injection sites on the right and 4 injection sites on the left Occipitalis 5 units into 3 injections sites on the right and 3 injection sites on the left  Cervical paraspinals 5 units into 2 injection sites on the right and 2 injection sites on the left trapezius 5 units into 3 injection sites on the right and 10U  3 injection sites on the left Bilateral splenius cap 5 Right Infraspinatus 5U Right Levator scap 5U Left levator scap 10U Left Infraspinatus 10U Total 200U no wastage Patient tolerated procedure well. Post procedure instructions given. Appointment for repeat injection in 3 months Medical management as per neurology

## 2018-09-28 ENCOUNTER — Encounter: Payer: Medicaid Other | Attending: Physical Medicine & Rehabilitation

## 2018-09-28 ENCOUNTER — Ambulatory Visit: Payer: Medicaid Other | Admitting: Physical Medicine & Rehabilitation

## 2018-09-28 VITALS — BP 144/92 | HR 92 | Ht 65.0 in | Wt 140.0 lb

## 2018-09-28 DIAGNOSIS — M7918 Myalgia, other site: Secondary | ICD-10-CM | POA: Diagnosis not present

## 2018-09-28 NOTE — Patient Instructions (Signed)
Small hematoma Right upper back please ice for 20 min every 2 hr today

## 2018-09-28 NOTE — Progress Notes (Signed)
Trigger points Bilateral levator scapula left trapezius left supraspinatus, right cervical paraspinal Trigger Point Injection  Indication: Cervical and parascapular myofascial pain not relieved by medication management and other conservative care.  Informed consent was obtained after describing risk and benefits of the procedure with the patient, this includes bleeding, bruising, infection and medication side effects.  The patient wishes to proceed and has given written consent.  The patient was placed in a seated position.  The skin over the muscles described above was marked and prepped with Betadine.  It was entered with a 25-gauge 1-1/2 inch needle and 1 mL of 1% lidocaine was injected into each of 5 trigger points, after negative draw back for blood.  The patient tolerated the procedure well.  Small hematoma right cervical paraspinal.  Advised ice 20 minutes every 2 hours to the right cervical paraspinal for the rest of the day.  Post procedure instructions were given.

## 2018-10-22 ENCOUNTER — Ambulatory Visit: Payer: Medicaid Other | Admitting: Physical Medicine & Rehabilitation

## 2018-11-26 ENCOUNTER — Other Ambulatory Visit: Payer: Self-pay | Admitting: Physical Medicine & Rehabilitation

## 2018-11-30 ENCOUNTER — Ambulatory Visit: Payer: Medicaid Other | Admitting: Physical Medicine & Rehabilitation

## 2018-12-25 ENCOUNTER — Ambulatory Visit: Payer: Medicaid Other | Admitting: Physical Medicine & Rehabilitation

## 2019-01-04 ENCOUNTER — Ambulatory Visit: Payer: Medicaid Other | Admitting: Physical Medicine & Rehabilitation

## 2019-01-18 ENCOUNTER — Other Ambulatory Visit: Payer: Self-pay | Admitting: Physical Medicine & Rehabilitation

## 2019-02-18 ENCOUNTER — Encounter: Payer: Medicaid Other | Admitting: Physical Medicine & Rehabilitation

## 2019-03-08 ENCOUNTER — Ambulatory Visit: Payer: Medicaid Other | Admitting: Physical Medicine & Rehabilitation

## 2019-03-23 ENCOUNTER — Other Ambulatory Visit: Payer: Self-pay | Admitting: Physical Medicine & Rehabilitation

## 2019-05-03 ENCOUNTER — Encounter: Payer: Medicaid Other | Admitting: Physical Medicine & Rehabilitation

## 2019-05-17 ENCOUNTER — Ambulatory Visit: Payer: Medicaid Other | Admitting: Physical Medicine & Rehabilitation

## 2019-05-19 ENCOUNTER — Other Ambulatory Visit: Payer: Self-pay | Admitting: Physical Medicine & Rehabilitation

## 2019-07-21 ENCOUNTER — Other Ambulatory Visit: Payer: Self-pay | Admitting: Physical Medicine & Rehabilitation

## 2019-08-23 ENCOUNTER — Other Ambulatory Visit: Payer: Self-pay | Admitting: Physical Medicine & Rehabilitation

## 2019-08-23 NOTE — Telephone Encounter (Signed)
Refill request for Cyclobenzaprine. No mention in notes to continue. Is this okay to refill?

## 2019-10-21 ENCOUNTER — Other Ambulatory Visit: Payer: Self-pay | Admitting: Physical Medicine & Rehabilitation

## 2019-12-08 ENCOUNTER — Other Ambulatory Visit: Payer: Self-pay | Admitting: Physical Medicine & Rehabilitation

## 2020-01-20 ENCOUNTER — Other Ambulatory Visit: Payer: Self-pay | Admitting: Physical Medicine & Rehabilitation

## 2020-03-21 ENCOUNTER — Other Ambulatory Visit: Payer: Self-pay | Admitting: Physical Medicine & Rehabilitation

## 2020-05-18 ENCOUNTER — Other Ambulatory Visit: Payer: Self-pay | Admitting: Physical Medicine & Rehabilitation

## 2020-08-02 ENCOUNTER — Other Ambulatory Visit: Payer: Self-pay | Admitting: Physical Medicine & Rehabilitation

## 2020-08-21 ENCOUNTER — Other Ambulatory Visit: Payer: Self-pay | Admitting: Physical Medicine & Rehabilitation

## 2020-09-26 ENCOUNTER — Encounter: Payer: Self-pay | Admitting: Physical Medicine & Rehabilitation

## 2020-09-26 ENCOUNTER — Other Ambulatory Visit: Payer: Self-pay

## 2020-09-26 ENCOUNTER — Encounter: Payer: Medicaid Other | Attending: Physical Medicine & Rehabilitation | Admitting: Physical Medicine & Rehabilitation

## 2020-09-26 VITALS — BP 180/93 | HR 94 | Temp 98.9°F | Ht 65.5 in | Wt 141.0 lb

## 2020-09-26 DIAGNOSIS — G43711 Chronic migraine without aura, intractable, with status migrainosus: Secondary | ICD-10-CM | POA: Diagnosis present

## 2020-09-26 DIAGNOSIS — M7918 Myalgia, other site: Secondary | ICD-10-CM | POA: Insufficient documentation

## 2020-09-26 MED ORDER — CYCLOBENZAPRINE HCL 10 MG PO TABS: 10.0000 mg | ORAL_TABLET | Freq: Three times a day (TID) | ORAL | 6 refills | Status: DC | PRN

## 2020-09-26 NOTE — Progress Notes (Addendum)
Subjective:    Patient ID: Peggy Davis, female    DOB: 1960-11-27, 60 y.o.   MRN: [id number]  HPI 60 yo with hx of chronic migraine, myofascial pain, Arnold Chiari malformation Pt's other pains trigger migraines, no food triggers Sleep is overall better   Flexeril helps with muscle pain at night , has been taking 20mg  at night  Started training and has hives. She remains independent with all self-care and mobility. The patient is learning how to pace her activities and reduces activity level on "bad days" Pain Inventory Average Pain 7 Pain Right Now 6 My pain is intermittent, constant, sharp, burning and stabbing  In the last 24 hours, has pain interfered with the following? General activity 5 Relation with others 7 Enjoyment of life 6 What TIME of day is your pain at its worst? morning , daytime, evening and night Sleep (in general) Fair  Pain is worse with: walking, bending, sitting, inactivity, standing and some activites Pain improves with: rest, heat/ice and medication Relief from Meds: 5  Family History  Problem Relation Age of Onset  . Diabetes Mother   . Cancer Father    Social History   Socioeconomic History  . Marital status: Legally Separated    Spouse name: Not on file  . Number of children: Not on file  . Years of education: Not on file  . Highest education level: Not on file  Occupational History  . Not on file  Tobacco Use  . Smoking status: Never Smoker  . Smokeless tobacco: Never Used  Substance and Sexual Activity  . Alcohol use: Not on file  . Drug use: Not on file  . Sexual activity: Not on file  Other Topics Concern  . Not on file  Social History Narrative  . Not on file   Social Determinants of Health   Financial Resource Strain: Not on file  Food Insecurity: Not on file  Transportation Needs: Not on file  Physical Activity: Not on file  Stress: Not on file  Social Connections: Not on file   Past Surgical  History:  Procedure Laterality Date  . ABDOMINAL HYSTERECTOMY    . APPENDECTOMY    . BRAIN SURGERY    . CHOLECYSTECTOMY    . exploratory on hand    . NASAL SINUS SURGERY     Past Surgical History:  Procedure Laterality Date  . ABDOMINAL HYSTERECTOMY    . APPENDECTOMY    . BRAIN SURGERY    . CHOLECYSTECTOMY    . exploratory on hand    . NASAL SINUS SURGERY     Past Medical History:  Diagnosis Date  . Allergy   . Anxiety   . Arthritis   . Asthma   . Chronic kidney disease   . Depression   . Hyperlipidemia   . Hypertension   . Neuromuscular disorder (HCC)    BP (!) 180/93   Pulse 94   Temp 98.9 F (37.2 C)   Ht 5' 5.5" (1.664 m)   Wt 141 lb (64 kg)   LMP 04/14/2006 (Exact Date)   SpO2 98%   BMI 23.11 kg/m   Opioid Risk Score:   Fall Risk Score:  `1  Depression screen PHQ 2/9  Depression screen [facility] 2/9 09/25/2018 08/03/2018 04/13/2018 03/26/2018 02/09/2018 12/16/2017 09/11/2015  Decreased Interest 0 0 0 0 0 0 2  Down, Depressed, Hopeless 0 0 0 0 0 0 0  PHQ - 2 Score 0 0 0 0 0 0  2  Altered sleeping - - - - - - 3  Tired, decreased energy - - - - - - 3  Change in appetite - - - - - - 1  Feeling bad or failure about yourself  - - - - - - 0  Trouble concentrating - - - - - - 3  Moving slowly or fidgety/restless - - - - - - 3  Suicidal thoughts - - - - - - 0  PHQ-9 Score - - - - - - 15  Difficult doing work/chores - - - - - - Very difficult    Review of Systems  Constitutional: Negative.   HENT: Negative.   Eyes: Negative.   Respiratory: Negative.   Cardiovascular: Negative.   Gastrointestinal: Negative.   Endocrine: Negative.   Genitourinary: Negative.   Musculoskeletal: Positive for arthralgias, back pain, myalgias, neck pain and neck stiffness.  Skin: Negative.   Allergic/Immunologic: Negative.   Neurological: Positive for weakness and numbness.       Tingling  Hematological: Negative.        Objective:   Physical Exam Vitals and nursing note  reviewed.  Constitutional:      Appearance: She is normal weight.  HENT:     Head: Normocephalic and atraumatic.  Eyes:     Extraocular Movements: Extraocular movements intact.     Conjunctiva/sclera: Conjunctivae normal.     Pupils: Pupils are equal, round, and reactive to light.  Musculoskeletal:        General: Tenderness present.     Right lower leg: No edema.     Left lower leg: No edema.     Comments: Tenderness palpation bilateral upper trapezius and infraspinatus area, moderate  Skin:    General: Skin is warm and dry.  Neurological:     General: No focal deficit present.     Mental Status: She is alert and oriented to person, place, and time.     Comments: Ambulates without assistive device no evidence of toe drag or knee instability Motor strength is 5/5 bilateral hip flexor knee extensor ankle dorsiflexor as well as bilateral deltoid bicep tricep grip  Psychiatric:        Mood and Affect: Mood normal.        Behavior: Behavior normal.        Thought Content: Thought content normal.        Judgment: Judgment normal.           Assessment & Plan:  #1.  Myofascial pain syndrome continue Flexeril 20 mg nightly she occasionally takes 1 tablet i.e. 10 mg in the morning.  She has been doing quite well without her trigger point injections.  She will call back if she has a particularly bad pain flareup  Printed out upper back exercises with Thera-Band's.  Patient will do them as directed  #2.  Chronic migraines actually doing quite well with this.  Patient to call if she wishes to resume botulinum toxin injections

## 2020-09-26 NOTE — Patient Instructions (Addendum)
Call for trigger point or migraine botox injections Shoulder Impingement Syndrome Rehab Ask your health care provider which exercises are safe for you. Do exercises exactly as told by your health care provider and adjust them as directed. It is normal to feel mild stretching, pulling, tightness, or discomfort as you do these exercises. Stop right away if you feel sudden pain or your pain gets worse. Do not begin these exercises until told by your health care provider. Stretching and range-of-motion exercise This exercise warms up your muscles and joints and improves the movement and flexibility of your shoulder. This exercise also helps to relieve pain and stiffness. Passive horizontal adduction In passive adduction, you use your other hand to move the injured arm toward your body. The injured arm does not move on its own. In this movement, your arm is moved across your body in the horizontal plane (horizontal adduction). 1. Sit or stand and pull your left / right elbow across your chest, toward your other shoulder. Stop when you feel a gentle stretch in the back of your shoulder and upper arm. ? Keep your arm at shoulder height. ? Keep your arm as close to your body as you comfortably can. 2. Hold for __________ seconds. 3. Slowly return to the starting position. Repeat __________ times. Complete this exercise __________ times a day.   Strengthening exercises These exercises build strength and endurance in your shoulder. Endurance is the ability to use your muscles for a long time, even after they get tired. External rotation, isometric This is an exercise in which you press the back of your wrist against a door frame without moving your shoulder joint (isometric). 1. Stand or sit in a doorway, facing the door frame. 2. Bend your left / right elbow and place the back of your wrist against the door frame. Only the back of your wrist should be touching the frame. Keep your upper arm at your  side. 3. Gently press your wrist against the door frame, as if you are trying to push your arm away from your abdomen (external rotation). Press as hard as you are able without pain. ? Avoid shrugging your shoulder while you press your wrist against the door frame. Keep your shoulder blade tucked down toward the middle of your back. 4. Hold for __________ seconds. 5. Slowly release the tension, and relax your muscles completely before you repeat the exercise. Repeat __________ times. Complete this exercise __________ times a day. Internal rotation, isometric This is an exercise in which you press your palm against a door frame without moving your shoulder joint (isometric). 1. Stand or sit in a doorway, facing the door frame. 2. Bend your left / right elbow and place the palm of your hand against the door frame. Only your palm should be touching the frame. Keep your upper arm at your side. 3. Gently press your hand against the door frame, as if you are trying to push your arm toward your abdomen (internal rotation). Press as hard as you are able without pain. ? Avoid shrugging your shoulder while you press your hand against the door frame. Keep your shoulder blade tucked down toward the middle of your back. 4. Hold for __________ seconds. 5. Slowly release the tension, and relax your muscles completely before you repeat the exercise. Repeat __________ times. Complete this exercise __________ times a day.   Scapular protraction, supine 1. Lie on your back on a firm surface (supine position). Hold a __________ weight in your left /  right hand. 2. Raise your left / right arm straight into the air so your hand is directly above your shoulder joint. 3. Push the weight into the air so your shoulder (scapula) lifts off the surface that you are lying on. The scapula will push up or forward (protraction). Do not move your head, neck, or back. 4. Hold for __________ seconds. 5. Slowly return to the  starting position. Let your muscles relax completely before you repeat this exercise. Repeat __________ times. Complete this exercise __________ times a day.   Scapular retraction 1. Sit in a stable chair without armrests, or stand up. 2. Secure an exercise band to a stable object in front of you so the band is at shoulder height. 3. Hold one end of the exercise band in each hand. Your palms should face down. 4. Squeeze your shoulder blades together (retraction) and move your elbows slightly behind you. Do not shrug your shoulders upward while you do this. 5. Hold for __________ seconds. 6. Slowly return to the starting position. Repeat __________ times. Complete this exercise __________ times a day.   Shoulder extension 1. Sit in a stable chair without armrests, or stand up. 2. Secure an exercise band to a stable object in front of you so the band is above shoulder height. 3. Hold one end of the exercise band in each hand. 4. Straighten your elbows and lift your hands up to shoulder height. 5. Squeeze your shoulder blades together and pull your hands down to the sides of your thighs (extension). Stop when your hands are straight down by your sides. Do not let your hands go behind your body. 6. Hold for __________ seconds. 7. Slowly return to the starting position. Repeat __________ times. Complete this exercise __________ times a day.   This information is not intended to replace advice given to you by your health care provider. Make sure you discuss any questions you have with your health care provider. Document Revised: 11/06/2018 Document Reviewed: 08/10/2018 Elsevier Patient Education  2021 [employer].

## 2021-03-26 ENCOUNTER — Other Ambulatory Visit: Payer: Self-pay | Admitting: Physical Medicine & Rehabilitation

## 2021-05-25 ENCOUNTER — Encounter: Payer: Medicaid Other | Admitting: Physical Medicine & Rehabilitation

## 2021-09-27 ENCOUNTER — Encounter: Payer: Medicaid Other | Admitting: Physical Medicine & Rehabilitation

## 2021-10-15 ENCOUNTER — Other Ambulatory Visit: Payer: Self-pay | Admitting: Physical Medicine & Rehabilitation

## 2021-11-23 ENCOUNTER — Encounter: Payer: Medicaid Other | Attending: Physical Medicine & Rehabilitation | Admitting: Physical Medicine & Rehabilitation

## 2021-11-23 ENCOUNTER — Encounter: Payer: Self-pay | Admitting: Physical Medicine & Rehabilitation

## 2021-11-23 VITALS — BP 174/85 | HR 91 | Ht 65.5 in | Wt 139.2 lb

## 2021-11-23 DIAGNOSIS — M47816 Spondylosis without myelopathy or radiculopathy, lumbar region: Secondary | ICD-10-CM

## 2021-11-23 DIAGNOSIS — M797 Fibromyalgia: Secondary | ICD-10-CM

## 2021-11-23 DIAGNOSIS — M7918 Myalgia, other site: Secondary | ICD-10-CM | POA: Diagnosis present

## 2021-11-23 DIAGNOSIS — M47812 Spondylosis without myelopathy or radiculopathy, cervical region: Secondary | ICD-10-CM | POA: Diagnosis present

## 2021-11-23 NOTE — Progress Notes (Signed)
? ?Subjective:  ? ? Patient ID: Peggy Davis, female    DOB: [birth year], 61 y.o.   MRN: [id number] ? ?HPI ?61 year old female with history of Chiari malformation, fibromyalgia syndrome, myofascial pain disorder who returns for 1 year follow-up.  She also has a history of migraine headaches has been worse over the last year. ?More pain everywhere ?Seen by PCP for general check up no issues ?[facility], MRI of the lumbar and cervical spine were ordered, reports reviewed.  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 ?The patient has some finger tingling and dropping objects has wrist pain left side greater than right side. ? ? ?Much more frequent migraines that limit driving and other movement, has not received botulinum toxin injections for greater than 1 year ? ?Pain Inventory ?Average Pain 8 ?Pain Right Now 6 ?My pain is constant, sharp, stabbing, and aching ? ?In the last 24 hours, has pain interfered with the following? ?General activity 10 ?Relation with others 10 ?Enjoyment of life 10 ?What TIME of day is your pain at its worst? varies ?Sleep (in general) Fair ? ?Pain is worse with: walking, bending, sitting, inactivity, standing, some activites, and lifting ?Pain improves with: rest and heat/ice ?Relief from Meds:  on no medication ? ?Family History  ?Problem Relation Age of Onset  ? Diabetes Mother   ? Cancer Father   ? ?Social History  ? ?Socioeconomic History  ? Marital status: Legally Separated  ?  Spouse name: Not on file  ? Number of children: Not on file  ? Years of education: Not on file  ? Highest education level: Not on file  ?Occupational History  ?  Not on file  ?Tobacco Use  ? Smoking status: Never  ? Smokeless tobacco: Never  ?Vaping Use  ? Vaping Use: Never used  ?Substance and Sexual Activity  ? Alcohol use: Not on file  ? Drug use: Not on file  ? Sexual activity: Not on file  ?Other Topics Concern  ? Not on file  ?Social History Narrative  ? Not on file  ? ?Social Determinants of Health  ? ?Financial Resource Strain: Not on file  ?Food Insecurity: Not on file  ?Transportation Needs: Not on file  ?Physical Activity: Not on file  ?Stress: Not on file  ?Social Connections: Not on file  ? ?Past Surgical History:  ?Procedure Laterality Date  ? ABDOMINAL HYSTERECTOMY    ? APPENDECTOMY    ? BRAIN SURGERY    ? CHOLECYSTECTOMY    ? exploratory on hand    ? NASAL SINUS SURGERY    ? ?Past Surgical History:  ?Procedure Laterality Date  ? ABDOMINAL HYSTERECTOMY    ? APPENDECTOMY    ? BRAIN SURGERY    ? CHOLECYSTECTOMY    ? exploratory on hand    ? NASAL SINUS SURGERY    ? ?Past Medical History:  ?Diagnosis Date  ? Allergy   ? Anxiety   ? Arthritis   ? Asthma   ? Chronic kidney disease   ? Depression   ? Hyperlipidemia   ? Hypertension   ? Neuromuscular disorder (HCC)   ? ?BP (!) 174/85   Pulse 91   Ht 5' 5.5" (1.664  m)   Wt 139 lb 3.2 oz (63.1 kg)   LMP 04/14/2006 (Exact Date)   SpO2 99%   BMI 22.81 kg/m?  ? ?Opioid Risk Score:   ?Fall Risk Score:  `1 ? ?Depression screen PHQ 2/9 ? ? ?  11/23/2021  ? 11:14 AM 09/25/2018  ? 12:02 PM 08/03/2018  ? 11:31 AM 04/13/2018  ? 11:19 AM 03/26/2018  ?  1:09 PM 02/09/2018  ? 11:36 AM 12/16/2017  ?  1:32 PM  ?Depression screen PHQ 2/9  ?Decreased Interest 0 0 0 0 0 0 0  ?Down, Depressed, Hopeless 0 0 0 0 0 0 0  ?PHQ - 2 Score 0 0 0 0 0 0 0  ?  ? ?Review of Systems  ?Constitutional: Negative.   ?HENT: Negative.    ?Eyes: Negative.   ?Respiratory: Negative.    ?Cardiovascular: Negative.   ?Gastrointestinal: Negative.   ?Endocrine: Negative.   ?Genitourinary: Negative.   ?Musculoskeletal:  Positive for back pain and neck pain.  ?Skin:  Negative.   ?Allergic/Immunologic: Negative.   ?Neurological:  Positive for headaches.  ?Hematological: Negative.   ?Psychiatric/Behavioral: Negative.    ? ?   ?Objective:  ? Physical Exam ?Vitals and nursing note reviewed.  ?Constitutional:   ?   Appearance: She is normal weight.  ?HENT:  ?   Head: Normocephalic and atraumatic.  ?Eyes:  ?   Extraocular Movements: Extraocular movements intact.  ?   Conjunctiva/sclera: Conjunctivae normal.  ?   Pupils: Pupils are equal, round, and reactive to light.  ?Skin: ?   General: Skin is warm and dry.  ?Neurological:  ?   Mental Status: She is alert and oriented to person, place, and time.  ?   Cranial Nerves: No dysarthria.  ?   Motor: Motor function is intact. No weakness, tremor or abnormal muscle tone.  ?   Coordination: Coordination normal. Finger-Nose-Finger Test and Heel to [facility] Test normal. Rapid alternating movements normal.  ?   Gait: Gait normal.  ?   Deep Tendon Reflexes: Reflexes are normal and symmetric.  ?Psychiatric:     ?   Mood and Affect: Mood normal.     ?   Behavior: Behavior normal.  ? ?Cervical spine range of motion 75% normal with flexion extension lateral bending and rotation some dizziness when looking upward.  No radicular symptoms. ?Lumbar spine has normal range of motion does have pain with lumbar extension greater than with flexion however ?Negative straight leg raising bilaterally ?Mild tenderness palpation in the cervical spares paraspinals ?Trigger points noted bilateral upper trap as well as multiple tender points along the thoracic paraspinals.  There is tenderness palpation lumbar paraspinals as well. ? ? ? ?   ?Assessment & Plan:  ?1.  Chronic widespread pain certainly has an element of fibromyalgia syndrome have recommended increasing ambulation time per day with goal of 30 minutes.  This does not need to be done all in the same session but spread throughout the day.  Patient has multiple drug allergies would not like to try any other  medication for this such as Lyrica. ?2.  Cervical pain certainly does have some degenerative changes as well as myofascial pain syndrome affecting the upper traps and the diagnosis of fibromyalgia syndrome concerning amplify any of the above complaints.  She may benefit from trigger point injections that she has in the past other potential treatment options would be dry needling versus acupuncture. ?3.  Lumbar pain we discussed medial branch blocks however they  are more invasive than trigger point injections that she has difficulty tolerating trigger points.  I do think she would benefit from core stabilization program.  Numeral 4.  Finger paresthesias negative Tinel's negative Phalen's, does have mild stenosis noted on cervical MRI, do think she could be better evaluated with EMG/NCV to evaluate for median nerve compression at the wrist.  We will schedule ? ?

## 2021-11-23 NOTE — Patient Instructions (Addendum)
Thumb spica splint , voltaren gel,  ? ?EMG/NCV to eval for carpal tunnel ? ?Walk per day or aquatic  ? ?Recommend Migraine practice ? ?Back Exercises ?These exercises help to make your trunk and back strong. They also help to keep the lower back flexible. Doing these exercises can help to prevent or lessen pain in your lower back. ?If you have back pain, try to do these exercises 2-3 times each day or as told by your doctor. ?As you get better, do the exercises once each day. Repeat the exercises more often as told by your doctor. ?To stop back pain from coming back, do the exercises once each day, or as told by your doctor. ?Do exercises exactly as told by your doctor. Stop right away if you feel sudden pain or your pain gets worse. ?Exercises ?Single knee to chest ?Do these steps 3-5 times in a row for each leg: ?Lie on your back on a firm bed or the floor with your legs stretched out. ?Bring one knee to your chest. ?Grab your knee or thigh with both hands and hold it in place. ?Pull on your knee until you feel a gentle stretch in your lower back or butt. ?Keep doing the stretch for 10-30 seconds. ?Slowly let go of your leg and straighten it. ?Pelvic tilt ?Do these steps 5-10 times in a row: ?Lie on your back on a firm bed or the floor with your legs stretched out. ?Bend your knees so they point up to the ceiling. Your feet should be flat on the floor. ?Tighten your lower belly (abdomen) muscles to press your lower back against the floor. This will make your tailbone point up to the ceiling instead of pointing down to your feet or the floor. ?Stay in this position for 5-10 seconds while you gently tighten your muscles and breathe evenly. ?Cat-cow ?Do these steps until your lower back bends more easily: ?Get on your hands and knees on a firm bed or the floor. Keep your hands under your shoulders, and keep your knees under your hips. You may put padding under your knees. ?Let your head hang down toward your  chest. Tighten (contract) the muscles in your belly. Point your tailbone toward the floor so your lower back becomes rounded like the back of a cat. ?Stay in this position for 5 seconds. ?Slowly lift your head. Let the muscles of your belly relax. Point your tailbone up toward the ceiling so your back forms a sagging arch like the back of a cow. ?Stay in this position for 5 seconds. ? ?Press-ups ?Do these steps 5-10 times in a row: ?Lie on your belly (face-down) on a firm bed or the floor. ?Place your hands near your head, about shoulder-width apart. ?While you keep your back relaxed and keep your hips on the floor, slowly straighten your arms to raise the top half of your body and lift your shoulders. Do not use your back muscles. You may change where you place your hands to make yourself more comfortable. ?Stay in this position for 5 seconds. Keep your back relaxed. ?Slowly return to lying flat on the floor. ? ?Bridges ?Do these steps 10 times in a row: ?Lie on your back on a firm bed or the floor. ?Bend your knees so they point up to the ceiling. Your feet should be flat on the floor. Your arms should be flat at your sides, next to your body. ?Tighten your butt muscles and lift your butt off  the floor until your waist is almost as high as your knees. If you do not feel the muscles working in your butt and the back of your thighs, slide your feet 1-2 inches (2.5-5 cm) farther away from your butt. ?Stay in this position for 3-5 seconds. ?Slowly lower your butt to the floor, and let your butt muscles relax. ?If this exercise is too easy, try doing it with your arms crossed over your chest. ?Belly crunches ?Do these steps 5-10 times in a row: ?Lie on your back on a firm bed or the floor with your legs stretched out. ?Bend your knees so they point up to the ceiling. Your feet should be flat on the floor. ?Cross your arms over your chest. ?Tip your chin a little bit toward your chest, but do not bend your  neck. ?Tighten your belly muscles and slowly raise your chest just enough to lift your shoulder blades a tiny bit off the floor. Avoid raising your body higher than that because it can put too much stress on your lower back. ?Slowly lower your chest and your head to the floor. ?Back lifts ?Do these steps 5-10 times in a row: ?Lie on your belly (face-down) with your arms at your sides, and rest your forehead on the floor. ?Tighten the muscles in your legs and your butt. ?Slowly lift your chest off the floor while you keep your hips on the floor. Keep the back of your head in line with the curve in your back. Look at the floor while you do this. ?Stay in this position for 3-5 seconds. ?Slowly lower your chest and your face to the floor. ?Contact a doctor if: ?Your back pain gets a lot worse when you do an exercise. ?Your back pain does not get better within 2 hours after you exercise. ?If you have any of these problems, stop doing the exercises. Do not do them again unless your doctor says it is okay. ?Get help right away if: ?You have sudden, very bad back pain. If this happens, stop doing the exercises. Do not do them again unless your doctor says it is okay. ?This information is not intended to replace advice given to you by your health care provider. Make sure you discuss any questions you have with your health care provider. ?Document Revised: 09/27/2020 Document Reviewed: 09/27/2020 ?Elsevier Patient Education ? 2023 [employer] ? ? ? ? ? ?

## 2022-01-08 ENCOUNTER — Encounter: Payer: Self-pay | Admitting: Physical Medicine & Rehabilitation

## 2022-01-08 ENCOUNTER — Encounter: Payer: Medicaid Other | Attending: Physical Medicine & Rehabilitation | Admitting: Physical Medicine & Rehabilitation

## 2022-01-08 VITALS — BP 132/72 | HR 82 | Ht 65.5 in | Wt 137.0 lb

## 2022-01-08 DIAGNOSIS — R202 Paresthesia of skin: Secondary | ICD-10-CM | POA: Insufficient documentation

## 2022-01-08 DIAGNOSIS — R200 Anesthesia of skin: Secondary | ICD-10-CM | POA: Insufficient documentation

## 2022-01-08 NOTE — Patient Instructions (Signed)
Recommend Xray Left wrist and hand to evaluate for Left carpo metacarpal osteoarthritis  EMG of Left Upper extremity was normal

## 2022-01-08 NOTE — Progress Notes (Signed)
Here for EMG/NCV LUE to evaluate bilateral hand numbness, dropping objects  Please see scanned report under media  In summary patient had a normal electrodiagnostic study of the left upper extremity no evidence of ulnar or median neuropathy.  No electrodiagnostic evidence of cervical radiculopathy.  Borderline prolonged superficial radial distal latency of questionable clinical significance.  Recommend x-ray base of thumb on the left to evaluate for OA, patient will ask her PCP to order

## 2022-05-20 ENCOUNTER — Other Ambulatory Visit: Payer: Self-pay | Admitting: Physical Medicine & Rehabilitation

## 2022-07-09 ENCOUNTER — Encounter: Payer: Medicaid Other | Admitting: Physical Medicine & Rehabilitation

## 2022-07-16 ENCOUNTER — Ambulatory Visit: Payer: Medicaid Other | Admitting: Physical Medicine & Rehabilitation

## 2022-08-06 ENCOUNTER — Encounter: Payer: Medicaid Other | Attending: Physical Medicine & Rehabilitation | Admitting: Physical Medicine & Rehabilitation

## 2022-08-06 ENCOUNTER — Encounter: Payer: Self-pay | Admitting: Physical Medicine & Rehabilitation

## 2022-08-06 VITALS — BP 171/99 | HR 95 | Ht 65.5 in | Wt 142.8 lb

## 2022-08-06 DIAGNOSIS — M7711 Lateral epicondylitis, right elbow: Secondary | ICD-10-CM | POA: Diagnosis not present

## 2022-08-06 DIAGNOSIS — M797 Fibromyalgia: Secondary | ICD-10-CM | POA: Diagnosis not present

## 2022-08-06 NOTE — Patient Instructions (Signed)
Tennis Elbow Rehab Ask your health care provider which exercises are safe for you. Do exercises exactly as told by your health care provider and adjust them as directed. It is normal to feel mild stretching, pulling, tightness, or discomfort as you do these exercises. Stop right away if you feel sudden pain or your pain gets worse. Do not begin these exercises until told by your health care provider. Stretching and range-of-motion exercises These exercises warm up your muscles and joints and improve the movement and flexibility of your elbow. Wrist flexion, assisted  Straighten your left / right elbow in front of you with your palm facing down toward the floor. If told by your health care provider, bend your left / right elbow to a 90-degree angle (right angle) at your side instead of holding it straight. With your other hand, gently push over the back of your left / right hand so your fingers point toward the floor (flexion). Stop when you feel a gentle stretch on the back of your forearm. Hold this position for __________ seconds. Repeat __________ times. Complete this exercise __________ times a day. Wrist extension, assisted  Straighten your left / right elbow in front of you with your palm facing up toward the ceiling. If told by your health care provider, bend your left / right elbow to a 90-degree angle (right angle) at your side instead of holding it straight. With your other hand, gently pull your left / right hand and fingers toward the floor (extension). Stop when you feel a gentle stretch on the palm side of your forearm. Hold this position for __________ seconds. Repeat __________ times. Complete this exercise __________ times a day. Assisted forearm rotation, supination Sit or stand with your elbows at your side. Bend your left / right elbow to a 90-degree angle (right angle). Using your uninjured hand, turn your left / right palm up toward the ceiling (supination) until you feel a  gentle stretch along the inside of your forearm. Hold this position for __________ seconds. Repeat __________ times. Complete this exercise __________ times a day. Assisted forearm rotation, pronation Sit or stand with your elbows at your side. Bend your left / right elbow to a 90-degree angle (right angle). Using your uninjured hand, turn your left / right palm down toward the floor (pronation) until you feel a gentle stretch along the outside of your forearm. Hold this position for __________ seconds. Repeat __________ times. Complete this exercise __________ times a day. Strengthening exercises These exercises build strength and endurance in your forearm and elbow. Endurance is the ability to use your muscles for a long time, even after they get tired. Radial deviation  Stand with a __________ weight or a hammer in your left / right hand. Or, sit while holding a rubber exercise band or tubing, with your left / right forearm supported on a table or countertop. Position your forearm so that the thumb is facing the ceiling, as if you are going to clap your hands. This is the neutral position. Raise your hand upward in front of you so your thumb moves toward the ceiling (radial deviation), or pull up on the rubber tubing. Keep your forearm and elbow still while you move your wrist only. Hold this position for __________ seconds. Slowly return to the starting position. Repeat __________ times. Complete this exercise __________ times a day. Wrist extension, eccentric Sit with your left / right forearm palm-down and supported on a table or other surface. Let your left /   right wrist extend over the edge of the surface. Hold a __________ weight or a piece of exercise band or tubing in your left / right hand. If using a rubber exercise band or tubing, hold the other end of the tubing with your other hand. Use your uninjured hand to move your left / right hand up toward the ceiling. Take your  uninjured hand away and slowly return to the starting position using only your left / right hand. Lowering your arm under tension is called eccentric extension. Repeat __________ times. Complete this exercise __________ times a day. Wrist extension Do not do this exercise if it causes pain at the outside of your elbow. Only do this exercise once instructed by your health care provider. Sit with your left / right forearm supported on a table or other surface and your palm turned down toward the floor. Let your left / right wrist extend over the edge of the surface. Hold a __________ weight or a piece of rubber exercise band or tubing. If you are using a rubber exercise band or tubing, hold the band or tubing in place with your other hand to provide resistance. Slowly bend your wrist so your hand moves up toward the ceiling (extension). Move only your wrist, keeping your forearm and elbow still. Hold this position for __________ seconds. Slowly return to the starting position. Repeat __________ times. Complete this exercise __________ times a day. Forearm rotation, supination To do this exercise, you will need a lightweight hammer or rubber mallet. Sit with your left / right forearm supported on a table or other surface. Bend your elbow to a 90-degree angle (right angle). Position your forearm so that your palm is facing down toward the floor, with your hand resting over the edge of the table. Hold a hammer in your left / right hand. To make this exercise easier, hold the hammer near the head of the hammer. To make this exercise harder, hold the hammer near the end of the handle. Without moving your wrist or elbow, slowly rotate your forearm so your palm faces up toward the ceiling (supination). Hold this position for __________ seconds. Slowly return to the starting position. Repeat __________ times. Complete this exercise __________ times a day. Shoulder blade squeeze Sit in a stable chair or  stand with good posture. If you are sitting down, do not let your back touch the back of the chair. Your arms should be at your sides with your elbows bent to a 90-degree angle (right angle). Position your forearms so that your thumbs are facing the ceiling (neutral position). Without lifting your shoulders up, squeeze your shoulder blades tightly together. Hold this position for __________ seconds. Slowly release and return to the starting position. Repeat __________ times. Complete this exercise __________ times a day. This information is not intended to replace advice given to you by your health care provider. Make sure you discuss any questions you have with your health care provider. Document Revised: 10/06/2019 Document Reviewed: 10/06/2019 Elsevier Patient Education  2023 [employer] Tennis Elbow  Tennis elbow is irritation and swelling (inflammation) in your outer forearm, near your elbow. Swelling affects the tissues that connect muscle to bone (tendons). Tennis elbow can happen playing any sport or doing any job where you use your elbow too much. It is caused by doing the same motion over and over. What are the causes? This condition is often caused by playing sports or doing work where you need to keep moving your  forearm the same way. Sometimes, it may be caused by a sudden injury. What increases the risk? You are more likely to get tennis elbow if you play tennis or another racket sport. You also have a higher risk if you often use your hands for work. This includes: People who use computers. Holiday representative workers. People who work in a factory. Musicians. Cooks. Cashiers. What are the signs or symptoms? Pain and tenderness in your forearm and the outer part of your elbow. You may have pain all the time or only when you use your arm. A burning feeling. This starts in your elbow and spreads down your arm. A weak grip in your hand. How is this treated? Resting and icing your  arm is often the first treatment. Your doctor may also recommend: Medicines to reduce pain and swelling. An elbow strap. Physical therapy. This may include massage or exercises or both. An elbow brace. If these do not help your symptoms get better, your doctor may recommend surgery. Follow these instructions at home: If you have a brace or strap: Wear the brace or strap as told by your doctor. Take it off only as told by your doctor. Check the skin around the brace or strap every day. Tell your doctor if you see problems. Loosen it if your fingers: Tingle. Become numb. Turn cold and blue. Keep the brace or strap clean. If the brace or strap is not waterproof: Do not let it get wet. Cover it with a watertight covering when you take a bath or a shower. Managing pain, stiffness, and swelling  If told, put ice on the injured area. To do this: If you have a removable brace or strap, take it off as told by your doctor. Put ice in a plastic bag. Place a towel between your skin and the bag. Leave the ice on for 20 minutes, 2-3 times a day. Take off the ice if your skin turns bright red. This is very important. If you cannot feel pain, heat, or cold, you have a greater risk of damage to the area. Move your fingers often. Activity Rest your elbow and wrist. Avoid activities that can cause elbow problems as told by your doctor. Do exercises as told by your doctor. If you lift an object, lift it with your palm facing up. Lifestyle If your tennis elbow is caused by sports, check your equipment and make sure that: You are using it the right way. It fits you well. If your tennis elbow is caused by work or computer use, take breaks often to stretch your arm. Talk with your manager about how you can make your condition better at work. General instructions Take over-the-counter and prescription medicines only as told by your doctor. Do not smoke or use any products that contain nicotine or  tobacco. If you need help quitting, ask your doctor. Keep all follow-up visits. How is this prevented? Before and after being active: Warm up and stretch before being active. Cool down and stretch after being active. Give your body time to rest between activities. While being active: Make sure to use equipment that fits you. If you play tennis, put power in your stroke with your lower body. Avoid using your arm only. Maintain physical fitness. This includes: Strength. Flexibility. Endurance. Do exercises to strengthen the forearm muscles. Contact a doctor if: Your pain does not get better with treatment. Your pain gets worse. You have weakness in your forearm, hand, or fingers. You cannot feel your forearm,  hand, or fingers. Get help right away if: Your pain is very bad. You cannot move your wrist. Summary Tennis elbow is irritation and swelling (inflammation) in your outer forearm, near your elbow. Tennis elbow is caused by doing the same motion over and over. Rest your elbow and wrist. Avoid activities as told by your doctor. If told, put ice on the injured area for 20 minutes, 2-3 times a day. This information is not intended to replace advice given to you by your health care provider. Make sure you discuss any questions you have with your health care provider. Document Revised: 01/25/2020 Document Reviewed: 01/25/2020 Elsevier Patient Education  [place].

## 2022-08-06 NOTE — Progress Notes (Signed)
Subjective:    Patient ID: Peggy Davis, female    DOB: [birth year], 61 y.o.   MRN: [id number]  HPI 62 year old female with history of Chiari malformation status postsurgical decompression as well as fibromyalgia syndrome. Left hand pain improved since last visit  Main complaint today is right elbow pain occasional with grasping.  She does not recall any specific injuries.  She has no hobbies such as crocheting that involve repetitive motion.  She does do some cooking and uses cast iron cookware. Pain Inventory Average Pain 6 Pain Right Now 7 My pain is constant, sharp, burning, stabbing, tingling, and aching  In the last 24 hours, has pain interfered with the following? General activity 8 Relation with others 7 Enjoyment of life 5 What TIME of day is your pain at its worst? morning , daytime, evening, and night Sleep (in general) Fair  Pain is worse with: bending Pain improves with: rest Relief from Meds:  na  Family History  Problem Relation Age of Onset   Diabetes Mother    Cancer Father    Social History   Socioeconomic History   Marital status: Legally Separated    Spouse name: Not on file   Number of children: Not on file   Years of education: Not on file   Highest education level: Not on file  Occupational History   Not on file  Tobacco Use   Smoking status: Never   Smokeless tobacco: Never  Vaping Use   Vaping Use: Never used  Substance and Sexual Activity   Alcohol use: Not on file   Drug use: Not on file   Sexual activity: Not on file  Other Topics Concern   Not on file  Social History Narrative   Not on file   Social Determinants of Health   Financial Resource Strain: Not on file  Food Insecurity: Not on file  Transportation Needs: Not on file  Physical Activity: Not on file  Stress: Not on file  Social Connections: Not on file   Past Surgical History:  Procedure Laterality Date   Lamoni     exploratory on hand     NASAL SINUS SURGERY     Past Surgical History:  Procedure Laterality Date   ABDOMINAL HYSTERECTOMY     APPENDECTOMY     BRAIN SURGERY     CHOLECYSTECTOMY     exploratory on hand     NASAL SINUS SURGERY     Past Medical History:  Diagnosis Date   Allergy    Anxiety    Arthritis    Asthma    Chronic kidney disease    Depression    Hyperlipidemia    Hypertension    Neuromuscular disorder ([place])    BP (!) 171/99   Pulse 95   Ht 5' 5.5" (1.664 m)   Wt 142 lb 12.8 oz (64.8 kg)   LMP 04/14/2006 (Exact Date)   SpO2 98%   BMI 23.40 kg/m   Opioid Risk Score:   Fall Risk Score:  `1  Depression screen [facility] 2/9     01/08/2022    3:18 PM 11/23/2021   11:14 AM 09/25/2018   12:02 PM 08/03/2018   11:31 AM 04/13/2018   11:19 AM 03/26/2018    1:09 PM 02/09/2018   11:36 AM  Depression screen [facility] 2/9  Decreased Interest 0 0 0 0 0 0 0  Down,  Depressed, Hopeless 0 0 0 0 0 0 0  PHQ - 2 Score 0 0 0 0 0 0 0      Review of Systems  Musculoskeletal:  Positive for back pain and neck pain.  All other systems reviewed and are negative.     Objective:   Physical Exam  Right elbow no evidence of joint deformity.  There is tenderness at the lateral epicondyle.  Mild tenderness just distal to this.  No wrist swelling No hand deformity Sensation normal in the right upper extremity to light touch There is mild pain with resisted wrist extension. Motor strength is 5/5 in the right deltoid bicep tricep grip      Assessment & Plan:  1.  Left elbow pain does not appear to be associated with any cervical spine pain.  Exam most consistent with lateral epicondylitis.  We discussed possible activities which may have caused onset of symptoms.  Most likely is her cooking with cast iron skillet's.  Have printed her out some exercises to do.  Recommend Voltaren gel 3 times daily or 4 times daily to right elbow I will see her back in 6 months.   She can call if problem does not resolve.  She may benefit from OT.  2.  Fibromyalgia continue with self-management continue walking, cyclobenzaprine 10 mg 3 times daily as needed

## 2022-12-13 ENCOUNTER — Other Ambulatory Visit: Payer: Self-pay | Admitting: Physical Medicine & Rehabilitation

## 2023-02-04 ENCOUNTER — Encounter: Payer: MEDICAID | Attending: Physical Medicine & Rehabilitation | Admitting: Physical Medicine & Rehabilitation

## 2023-02-04 ENCOUNTER — Encounter: Payer: Self-pay | Admitting: Physical Medicine & Rehabilitation

## 2023-02-04 VITALS — BP 165/84 | HR 78 | Ht 65.5 in | Wt 141.4 lb

## 2023-02-04 DIAGNOSIS — M7918 Myalgia, other site: Secondary | ICD-10-CM | POA: Insufficient documentation

## 2023-02-04 DIAGNOSIS — M797 Fibromyalgia: Secondary | ICD-10-CM | POA: Insufficient documentation

## 2023-02-04 DIAGNOSIS — M47812 Spondylosis without myelopathy or radiculopathy, cervical region: Secondary | ICD-10-CM | POA: Diagnosis not present

## 2023-02-04 NOTE — Progress Notes (Signed)
Subjective:    Patient ID: Peggy Davis, female    DOB: 1961-07-16, 62 y.o.   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 Patient is now complaining of some intermittent cramping of the right lower extremity Reviewed medication list she is on Diamox.  She does not take this on a daily basis.  She has not noticed whether or not her cramps occur when she takes the Diamox.    LUE EMG June 2023 Normal Pain Inventory Average Pain 6 Pain Right Now 6 My pain is constant, sharp, burning, stabbing, and aching  In the last 24 hours, has pain interfered with the following? General activity 6 Relation with others 6 Enjoyment of life 6 What TIME of day is your pain at its worst? varies Sleep (in general) Fair  Pain is worse with: some activites and lifting Pain improves with: rest, heat/ice, and medication Relief from Meds: 3  Family History  Problem Relation Age of Onset   Diabetes Mother    Cancer Father    Social History   Socioeconomic History   Marital status: Legally Separated    Spouse name: Not on file   Number of children: Not on file   Years of education: Not on file   Highest education level: Not on file  Occupational History   Not on file  Tobacco Use   Smoking status: Never   Smokeless tobacco: Never  Vaping Use   Vaping Use: Never used  Substance and Sexual Activity   Alcohol use: Not on file   Drug use: Not on file   Sexual activity: Not on file  Other Topics Concern   Not on file  Social History Narrative   Not on file   Social Determinants of Health   Financial Resource Strain: Not on file  Food Insecurity: Not on file  Transportation Needs: Not on file  Physical Activity: Not on file  Stress: Not on file  Social Connections: Not on file   Past Surgical History:  Procedure Laterality Date   ABDOMINAL  HYSTERECTOMY     APPENDECTOMY     BRAIN SURGERY     CHOLECYSTECTOMY     exploratory on hand     NASAL SINUS SURGERY     Past Surgical History:  Procedure Laterality Date   ABDOMINAL HYSTERECTOMY     APPENDECTOMY     BRAIN SURGERY     CHOLECYSTECTOMY     exploratory on hand     NASAL SINUS SURGERY     Past Medical History:  Diagnosis Date   Allergy    Anxiety    Arthritis    Asthma    Chronic kidney disease    Depression    Hyperlipidemia    Hypertension    Neuromuscular disorder ([facility])    BP (!) 165/84   Pulse 78   Ht 5' 5.5" (1.664 m)   Wt 141 lb 6.4 oz (64.1 kg)   LMP 04/14/2006 (Exact Date)   SpO2 97%   BMI 23.17 kg/m   Opioid Risk Score:   Fall Risk Score:  `1  Depression screen [facility] 2/9     01/08/2022    3:18 PM 11/23/2021   11:14 AM 09/25/2018   12:02 PM 08/03/2018   11:31 AM 04/13/2018   11:19 AM 03/26/2018    1:09 PM 02/09/2018   11:36 AM  Depression  screen PHQ 2/9  Decreased Interest 0 0 0 0 0 0 0  Down, Depressed, Hopeless 0 0 0 0 0 0 0  PHQ - 2 Score 0 0 0 0 0 0 0     Review of Systems  Musculoskeletal:  Positive for back pain.       Right arm pain  Neurological:  Positive for dizziness.  All other systems reviewed and are negative.     Objective:   Physical Exam General no acute distress Mood and affect appropriate Neck is good range of motion Upper extremity and lower extremity strength is good Ambulates without device no evidence of toe drag or knee instability Patient has marked areas of discomfort circled by her husband over her upper back area.  These include mid thoracic spinous process around T7 as well as right spine of the scapula.  In addition there is 1 muscular area along the left medial scapula border around the midpoint. Hands have no evidence of intrinsic atrophy Right foot and ankle without swelling 1+ pulse in the right dorsalis pedis 2+ at the posterior tib no pain with right ankle or foot range of motion normal strength in  the foot and ankle area.       Assessment & Plan:  #1.  History of Chiari malformation status post decompression.  Most recent MRI shows no evidence of skull base or cervical stenosis.  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  This could contribute to her left greater than right upper extremity paresthesias.  She was checked with an EMG to rule out carpal tunnel or ulnar neuropathy last year and this was a normal study from June 2023. 2.  Myofascial pain syndrome I discussed with the nurse practitioner visit from [facility] in April 2024.  At that point documentation suggest that trigger points were not helpful for her.  The patient states that she felt some relief with the trigger point injections.  We discussed that of the areas that I examined there was only 1 trigger point.  The patient would like to pursue injection.  Will make appointment to do this.  Will inject lidocaine. Left Rhomboid trigger point inj

## 2023-04-08 ENCOUNTER — Encounter: Payer: MEDICAID | Attending: Physical Medicine & Rehabilitation | Admitting: Physical Medicine & Rehabilitation

## 2023-04-08 ENCOUNTER — Encounter: Payer: Self-pay | Admitting: Physical Medicine & Rehabilitation

## 2023-04-08 VITALS — BP 164/85 | HR 76 | Ht 65.5 in | Wt 143.0 lb

## 2023-04-08 DIAGNOSIS — M7918 Myalgia, other site: Secondary | ICD-10-CM | POA: Insufficient documentation

## 2023-04-08 MED ORDER — LIDOCAINE HCL 1 % IJ SOLN: 2.0000 mL | Freq: Once | INTRAMUSCULAR | Status: AC

## 2023-04-08 NOTE — Patient Instructions (Signed)

## 2023-04-08 NOTE — Progress Notes (Signed)
Trigger Point Injection  Indication: Left periscapular  Myofascial pain not relieved by medication management and other conservative care.  Informed consent was obtained after describing risk and benefits of the procedure with the patient, this includes bleeding, bruising, infection and medication side effects.  The patient wishes to proceed and has given written consent.  The patient was placed in a seated position.  The medial periscapular area was marked and prepped with Betadine.  It was entered with a 25-gauge 1-1/2 inch needle and 1 mL of 1% lidocaine was injected into each of 2  trigger points, Left infraspinatus and left serratus ant after negative draw back for blood.  The patient tolerated the procedure well.  Post procedure instructions were given.

## 2023-06-20 ENCOUNTER — Other Ambulatory Visit: Payer: Self-pay | Admitting: Physical Medicine & Rehabilitation

## 2024-01-12 ENCOUNTER — Other Ambulatory Visit: Payer: Self-pay | Admitting: Physical Medicine & Rehabilitation

## 2024-01-23 ENCOUNTER — Other Ambulatory Visit: Payer: Self-pay | Admitting: Otolaryngology

## 2024-01-23 DIAGNOSIS — R1314 Dysphagia, pharyngoesophageal phase: Secondary | ICD-10-CM

## 2024-08-05 ENCOUNTER — Other Ambulatory Visit: Payer: Self-pay | Admitting: Physical Medicine & Rehabilitation

## 2024-08-09 NOTE — Telephone Encounter (Signed)
 Requested Prescriptions   Pending Prescriptions Disp Refills   cyclobenzaprine  (FLEXERIL ) 10 MG tablet [Pharmacy Med Name: cyclobenzaprine  10 mg tablet] 90 tablet 6    Sig: TAKE 1 TABLET BY MOUTH 3 TIMES DAILY AS NEEDED     Date of patient request: 08/09/2024 Last office visit: 04/08/2023 Upcoming visit: No upcoming visit Date of last refill: 01/13/2024 Last refill amount: #90 6 refills  Patient hasn't been seen in over a year
